# Patient Record
Sex: Female | Born: 1935
Health system: Southern US, Community
[De-identification: ages and names within clinical notes are randomized; demographics above are authoritative.]

## PROBLEM LIST (undated history)

## (undated) DIAGNOSIS — I639 Cerebral infarction, unspecified: Secondary | ICD-10-CM

## (undated) DIAGNOSIS — R413 Other amnesia: Secondary | ICD-10-CM

## (undated) DIAGNOSIS — S12100A Unspecified displaced fracture of second cervical vertebra, initial encounter for closed fracture: Secondary | ICD-10-CM

## (undated) HISTORY — PX: SKIN CANCER EXCISION: SHX779

## (undated) HISTORY — PX: ABDOMINAL HYSTERECTOMY: SHX81

## (undated) HISTORY — PX: CATARACT EXTRACTION: SUR2

---

## 2011-04-03 DIAGNOSIS — L293 Anogenital pruritus, unspecified: Secondary | ICD-10-CM | POA: Diagnosis not present

## 2011-04-03 DIAGNOSIS — N952 Postmenopausal atrophic vaginitis: Secondary | ICD-10-CM | POA: Diagnosis not present

## 2011-06-20 DIAGNOSIS — N760 Acute vaginitis: Secondary | ICD-10-CM | POA: Diagnosis not present

## 2011-08-01 DIAGNOSIS — L293 Anogenital pruritus, unspecified: Secondary | ICD-10-CM | POA: Diagnosis not present

## 2011-09-18 DIAGNOSIS — Z23 Encounter for immunization: Secondary | ICD-10-CM | POA: Diagnosis not present

## 2011-09-18 DIAGNOSIS — Z0000 Encounter for general adult medical examination without abnormal findings: Secondary | ICD-10-CM | POA: Diagnosis not present

## 2011-09-18 DIAGNOSIS — Z136 Encounter for screening for cardiovascular disorders: Secondary | ICD-10-CM | POA: Diagnosis not present

## 2011-09-18 DIAGNOSIS — R319 Hematuria, unspecified: Secondary | ICD-10-CM | POA: Diagnosis not present

## 2011-09-18 DIAGNOSIS — Z85828 Personal history of other malignant neoplasm of skin: Secondary | ICD-10-CM | POA: Diagnosis not present

## 2011-09-18 DIAGNOSIS — N760 Acute vaginitis: Secondary | ICD-10-CM | POA: Diagnosis not present

## 2011-09-18 DIAGNOSIS — M810 Age-related osteoporosis without current pathological fracture: Secondary | ICD-10-CM | POA: Diagnosis not present

## 2011-09-25 DIAGNOSIS — H2510 Age-related nuclear cataract, unspecified eye: Secondary | ICD-10-CM | POA: Diagnosis not present

## 2011-09-27 DIAGNOSIS — N904 Leukoplakia of vulva: Secondary | ICD-10-CM | POA: Diagnosis not present

## 2011-09-27 DIAGNOSIS — F329 Major depressive disorder, single episode, unspecified: Secondary | ICD-10-CM | POA: Diagnosis not present

## 2011-09-27 DIAGNOSIS — M810 Age-related osteoporosis without current pathological fracture: Secondary | ICD-10-CM | POA: Diagnosis not present

## 2011-10-25 DIAGNOSIS — N72 Inflammatory disease of cervix uteri: Secondary | ICD-10-CM | POA: Diagnosis not present

## 2011-10-28 DIAGNOSIS — S01501A Unspecified open wound of lip, initial encounter: Secondary | ICD-10-CM | POA: Diagnosis not present

## 2011-10-28 DIAGNOSIS — L539 Erythematous condition, unspecified: Secondary | ICD-10-CM | POA: Diagnosis not present

## 2011-10-28 DIAGNOSIS — R229 Localized swelling, mass and lump, unspecified: Secondary | ICD-10-CM | POA: Diagnosis not present

## 2011-10-28 DIAGNOSIS — S20219A Contusion of unspecified front wall of thorax, initial encounter: Secondary | ICD-10-CM | POA: Diagnosis not present

## 2011-10-28 DIAGNOSIS — S62109A Fracture of unspecified carpal bone, unspecified wrist, initial encounter for closed fracture: Secondary | ICD-10-CM | POA: Diagnosis not present

## 2011-10-31 DIAGNOSIS — M19049 Primary osteoarthritis, unspecified hand: Secondary | ICD-10-CM | POA: Diagnosis not present

## 2011-11-05 DIAGNOSIS — Z4802 Encounter for removal of sutures: Secondary | ICD-10-CM | POA: Diagnosis not present

## 2011-11-05 DIAGNOSIS — Z85828 Personal history of other malignant neoplasm of skin: Secondary | ICD-10-CM | POA: Diagnosis not present

## 2011-11-05 DIAGNOSIS — T148XXA Other injury of unspecified body region, initial encounter: Secondary | ICD-10-CM | POA: Diagnosis not present

## 2011-11-14 DIAGNOSIS — M25549 Pain in joints of unspecified hand: Secondary | ICD-10-CM | POA: Diagnosis not present

## 2011-12-05 DIAGNOSIS — S0993XA Unspecified injury of face, initial encounter: Secondary | ICD-10-CM | POA: Diagnosis not present

## 2011-12-05 DIAGNOSIS — S199XXA Unspecified injury of neck, initial encounter: Secondary | ICD-10-CM | POA: Diagnosis not present

## 2012-09-19 DIAGNOSIS — M810 Age-related osteoporosis without current pathological fracture: Secondary | ICD-10-CM | POA: Diagnosis not present

## 2012-09-19 DIAGNOSIS — Z23 Encounter for immunization: Secondary | ICD-10-CM | POA: Diagnosis not present

## 2012-09-19 DIAGNOSIS — Z0000 Encounter for general adult medical examination without abnormal findings: Secondary | ICD-10-CM | POA: Diagnosis not present

## 2012-09-19 DIAGNOSIS — Z131 Encounter for screening for diabetes mellitus: Secondary | ICD-10-CM | POA: Diagnosis not present

## 2012-09-19 DIAGNOSIS — Z136 Encounter for screening for cardiovascular disorders: Secondary | ICD-10-CM | POA: Diagnosis not present

## 2012-09-22 ENCOUNTER — Other Ambulatory Visit: Payer: Self-pay | Admitting: Family Medicine

## 2012-09-22 DIAGNOSIS — M810 Age-related osteoporosis without current pathological fracture: Secondary | ICD-10-CM

## 2012-09-22 DIAGNOSIS — Z1231 Encounter for screening mammogram for malignant neoplasm of breast: Secondary | ICD-10-CM

## 2012-09-30 DIAGNOSIS — H2510 Age-related nuclear cataract, unspecified eye: Secondary | ICD-10-CM | POA: Diagnosis not present

## 2012-10-01 DIAGNOSIS — Z1211 Encounter for screening for malignant neoplasm of colon: Secondary | ICD-10-CM | POA: Diagnosis not present

## 2012-10-14 ENCOUNTER — Ambulatory Visit
Admission: RE | Admit: 2012-10-14 | Discharge: 2012-10-14 | Disposition: A | Discharge status: Home / Self Care | Payer: Medicare Other | Source: Ambulatory Visit | Attending: Family Medicine | Admitting: Family Medicine

## 2012-10-14 DIAGNOSIS — M810 Age-related osteoporosis without current pathological fracture: Secondary | ICD-10-CM

## 2012-10-14 DIAGNOSIS — Z1231 Encounter for screening mammogram for malignant neoplasm of breast: Secondary | ICD-10-CM | POA: Diagnosis not present

## 2012-11-07 DIAGNOSIS — H2589 Other age-related cataract: Secondary | ICD-10-CM | POA: Diagnosis not present

## 2012-11-07 DIAGNOSIS — H04129 Dry eye syndrome of unspecified lacrimal gland: Secondary | ICD-10-CM | POA: Diagnosis not present

## 2012-12-01 DIAGNOSIS — H25049 Posterior subcapsular polar age-related cataract, unspecified eye: Secondary | ICD-10-CM | POA: Diagnosis not present

## 2012-12-01 DIAGNOSIS — H2510 Age-related nuclear cataract, unspecified eye: Secondary | ICD-10-CM | POA: Diagnosis not present

## 2012-12-01 DIAGNOSIS — H25019 Cortical age-related cataract, unspecified eye: Secondary | ICD-10-CM | POA: Diagnosis not present

## 2012-12-02 ENCOUNTER — Emergency Department (HOSPITAL_BASED_OUTPATIENT_CLINIC_OR_DEPARTMENT_OTHER): Payer: Medicare Other

## 2012-12-02 ENCOUNTER — Encounter (HOSPITAL_BASED_OUTPATIENT_CLINIC_OR_DEPARTMENT_OTHER): Payer: Self-pay | Admitting: Emergency Medicine

## 2012-12-02 ENCOUNTER — Emergency Department (HOSPITAL_BASED_OUTPATIENT_CLINIC_OR_DEPARTMENT_OTHER)
Admission: EM | Admit: 2012-12-02 | Discharge: 2012-12-03 | Disposition: A | Discharge status: Home / Self Care | Payer: Medicare Other | Attending: Emergency Medicine | Admitting: Emergency Medicine

## 2012-12-02 DIAGNOSIS — W010XXA Fall on same level from slipping, tripping and stumbling without subsequent striking against object, initial encounter: Secondary | ICD-10-CM | POA: Insufficient documentation

## 2012-12-02 DIAGNOSIS — Y92009 Unspecified place in unspecified non-institutional (private) residence as the place of occurrence of the external cause: Secondary | ICD-10-CM | POA: Insufficient documentation

## 2012-12-02 DIAGNOSIS — IMO0002 Reserved for concepts with insufficient information to code with codable children: Secondary | ICD-10-CM

## 2012-12-02 DIAGNOSIS — S79919A Unspecified injury of unspecified hip, initial encounter: Secondary | ICD-10-CM | POA: Insufficient documentation

## 2012-12-02 DIAGNOSIS — W1809XA Striking against other object with subsequent fall, initial encounter: Secondary | ICD-10-CM | POA: Insufficient documentation

## 2012-12-02 DIAGNOSIS — Y9301 Activity, walking, marching and hiking: Secondary | ICD-10-CM | POA: Insufficient documentation

## 2012-12-02 DIAGNOSIS — S0180XA Unspecified open wound of other part of head, initial encounter: Secondary | ICD-10-CM | POA: Diagnosis not present

## 2012-12-02 NOTE — ED Notes (Signed)
Pt fell  Has 1 cm lac to rt eye brow, abrasion to rt cheek,  Denies loc,  Pt a&o C/o left hip pain  Good lower ext movement

## 2012-12-02 NOTE — ED Notes (Signed)
Patient transported to X-ray 

## 2012-12-02 NOTE — ED Provider Notes (Signed)
CSN: [id number]     Arrival date & time 12/02/12  2324 History   First MD Initiated Contact with Patient 12/02/12 2328     This chart was scribed for Leona Pressly Smitty Cords, MD by Manuela Schwartz, ED scribe. This patient was seen in room MH10/MH10 and the patient's care was started at 2328.  Chief Complaint  Patient presents with  . Fall   Patient is a 79 y.o. female presenting with fall. The history is provided by the patient. No language interpreter was used.  Fall This is a new problem. The current episode started 1 to 2 hours ago. The problem has not changed since onset.Pertinent negatives include no chest pain, no abdominal pain and no shortness of breath. Nothing aggravates the symptoms. Nothing relieves the symptoms. She has tried nothing for the symptoms.   HPI Comments: Cheyenne Andrews is a 79 y.o. female who presents to the Emergency Department complaining of laceration to her right eyebrow and left hip pain after she tripped/fell this PM at home into the corner of a wall, no LOC, ambulating normally w/out pain. She states no other injuries besides lac to eyebrow and left hip pain.   History reviewed. No pertinent past medical history. Past Surgical History  Procedure Laterality Date  . Cataract extraction    . Abdominal hysterectomy     No family history on file. History  Substance Use Topics  . Smoking status: Never Smoker   . Smokeless tobacco: Not on file  . Alcohol Use: No   OB History   Grav Para Term Preterm Abortions TAB SAB Ect Mult Living                 Review of Systems  Constitutional: Negative for fever and chills.  HENT: Negative for congestion and rhinorrhea.   Respiratory: Negative for cough and shortness of breath.   Cardiovascular: Negative for chest pain.  Gastrointestinal: Negative for nausea, vomiting, abdominal pain and diarrhea.  Musculoskeletal: Negative for back pain.       Left hip pain  Skin: Positive for wound (lac to right eyebrow).  Negative for color change and rash.  Neurological: Negative for syncope.  All other systems reviewed and are negative.   A complete 10 system review of systems was obtained and all systems are negative except as noted in the HPI and PMH.   Allergies  Review of patient's allergies indicates no known allergies.  Home Medications   Current Outpatient Rx  Name  Route  Sig  Dispense  Refill  . FLUoxetine (PROZAC) 10 MG capsule   Oral   Take 10 mg by mouth daily.         . Multiple Vitamin (MULTIVITAMIN) tablet   Oral   Take 1 tablet by mouth daily.          Triage Vitals: Ht 5\' 2"  (1.575 m)  Wt 114 lb (51.71 kg)  BMI 20.85 kg/m2 Physical Exam  Nursing note and vitals reviewed. Constitutional: She is oriented to person, place, and time. She appears well-developed and well-nourished. No distress.  HENT:  Head: Normocephalic and atraumatic.  1 cm vertical laceration through right eyebrow No hemotympanum on right or left  Eyes: Conjunctivae are normal. Right eye exhibits no discharge. Left eye exhibits no discharge.  Neck: Normal range of motion. No tracheal deviation present.  Cardiovascular: Normal rate, regular rhythm and normal heart sounds.   No murmur heard. Pulmonary/Chest: Effort normal and breath sounds normal. No respiratory distress. She has no  wheezes. She has no rales.  Musculoskeletal: Normal range of motion. She exhibits no edema.  No bruising to left hip  Neurological: She is alert and oriented to person, place, and time.  Skin: Skin is warm and dry.  Psychiatric: She has a normal mood and affect. Thought content normal.    ED Course  LACERATION REPAIR Date/Time: 12/03/2012 12:29 AM Performed by: Jasmine Awe Authorized by: Jasmine Awe Consent: Verbal consent obtained. Consent given by: patient Patient identity confirmed: arm band Body area: head/neck Location details: right eyebrow Laceration length: 1 cm Tendon involvement:  none Nerve involvement: none Vascular damage: no Anesthesia: local infiltration Local anesthetic: lidocaine 1% with epinephrine Anesthetic total: 3 ml Patient sedated: no Irrigation solution: saline Irrigation method: syringe Amount of cleaning: extensive Debridement: none Degree of undermining: none Skin closure: 6-0 nylon Technique: simple Approximation: close Approximation difficulty: simple Dressing: 4x4 sterile gauze Patient tolerance: Patient tolerated the procedure well with no immediate complications.   (including critical care time) DIAGNOSTIC STUDIES: Oxygen Saturation is 98% on room air, normal by my interpretation.    COORDINATION OF CARE: At 1130 PM Discussed treatment plan with patient which includes left hip X-ray, lac repair. Patient agrees.   Labs Review Labs Reviewed - No data to display Imaging Review No results found.  EKG Interpretation   None       MDM  No diagnosis found.  Suture removal in 5 days  I personally performed the services described in this documentation, which was scribed in my presence. The recorded information has been reviewed and is accurate.      Jasmine Awe, MD 12/03/12 0030

## 2012-12-02 NOTE — ED Notes (Signed)
Returned from ct 

## 2012-12-02 NOTE — ED Notes (Signed)
Pt reports slipped while walking and fell into the edge of door jam.  No loc. Ambulatory.  Laceration to right eyebrow area.

## 2013-02-09 ENCOUNTER — Ambulatory Visit: Payer: Medicare Other | Attending: Family Medicine | Admitting: Rehabilitative and Restorative Service Providers"

## 2013-02-09 DIAGNOSIS — R269 Unspecified abnormalities of gait and mobility: Secondary | ICD-10-CM | POA: Insufficient documentation

## 2013-02-09 DIAGNOSIS — M6281 Muscle weakness (generalized): Secondary | ICD-10-CM | POA: Insufficient documentation

## 2013-02-09 DIAGNOSIS — IMO0001 Reserved for inherently not codable concepts without codable children: Secondary | ICD-10-CM | POA: Insufficient documentation

## 2013-02-17 ENCOUNTER — Ambulatory Visit: Payer: Medicare Other | Attending: Family Medicine | Admitting: Rehabilitative and Restorative Service Providers"

## 2013-02-17 DIAGNOSIS — R269 Unspecified abnormalities of gait and mobility: Secondary | ICD-10-CM | POA: Diagnosis not present

## 2013-02-17 DIAGNOSIS — IMO0001 Reserved for inherently not codable concepts without codable children: Secondary | ICD-10-CM | POA: Diagnosis not present

## 2013-02-17 DIAGNOSIS — M6281 Muscle weakness (generalized): Secondary | ICD-10-CM | POA: Insufficient documentation

## 2013-02-25 ENCOUNTER — Ambulatory Visit: Payer: Medicare Other | Admitting: Rehabilitative and Restorative Service Providers"

## 2013-02-25 DIAGNOSIS — M6281 Muscle weakness (generalized): Secondary | ICD-10-CM | POA: Diagnosis not present

## 2013-02-25 DIAGNOSIS — IMO0001 Reserved for inherently not codable concepts without codable children: Secondary | ICD-10-CM | POA: Diagnosis not present

## 2013-02-25 DIAGNOSIS — R269 Unspecified abnormalities of gait and mobility: Secondary | ICD-10-CM | POA: Diagnosis not present

## 2013-03-02 ENCOUNTER — Ambulatory Visit: Payer: Medicare Other | Admitting: Rehabilitative and Restorative Service Providers"

## 2013-03-02 DIAGNOSIS — M6281 Muscle weakness (generalized): Secondary | ICD-10-CM | POA: Diagnosis not present

## 2013-03-02 DIAGNOSIS — IMO0001 Reserved for inherently not codable concepts without codable children: Secondary | ICD-10-CM | POA: Diagnosis not present

## 2013-03-02 DIAGNOSIS — R269 Unspecified abnormalities of gait and mobility: Secondary | ICD-10-CM | POA: Diagnosis not present

## 2013-03-04 DIAGNOSIS — L909 Atrophic disorder of skin, unspecified: Secondary | ICD-10-CM | POA: Diagnosis not present

## 2013-03-04 DIAGNOSIS — L570 Actinic keratosis: Secondary | ICD-10-CM | POA: Diagnosis not present

## 2013-03-04 DIAGNOSIS — Z85828 Personal history of other malignant neoplasm of skin: Secondary | ICD-10-CM | POA: Diagnosis not present

## 2013-03-04 DIAGNOSIS — L819 Disorder of pigmentation, unspecified: Secondary | ICD-10-CM | POA: Diagnosis not present

## 2013-03-04 DIAGNOSIS — L821 Other seborrheic keratosis: Secondary | ICD-10-CM | POA: Diagnosis not present

## 2013-03-17 ENCOUNTER — Ambulatory Visit: Payer: Medicare Other | Attending: Family Medicine | Admitting: Rehabilitative and Restorative Service Providers"

## 2013-03-17 DIAGNOSIS — IMO0001 Reserved for inherently not codable concepts without codable children: Secondary | ICD-10-CM | POA: Insufficient documentation

## 2013-03-17 DIAGNOSIS — M6281 Muscle weakness (generalized): Secondary | ICD-10-CM | POA: Diagnosis not present

## 2013-03-17 DIAGNOSIS — R269 Unspecified abnormalities of gait and mobility: Secondary | ICD-10-CM | POA: Insufficient documentation

## 2013-04-21 DIAGNOSIS — H2510 Age-related nuclear cataract, unspecified eye: Secondary | ICD-10-CM | POA: Diagnosis not present

## 2013-07-15 DIAGNOSIS — H2589 Other age-related cataract: Secondary | ICD-10-CM | POA: Diagnosis not present

## 2013-07-15 DIAGNOSIS — Z961 Presence of intraocular lens: Secondary | ICD-10-CM | POA: Diagnosis not present

## 2013-07-21 DIAGNOSIS — H2510 Age-related nuclear cataract, unspecified eye: Secondary | ICD-10-CM | POA: Diagnosis not present

## 2013-07-27 DIAGNOSIS — H25049 Posterior subcapsular polar age-related cataract, unspecified eye: Secondary | ICD-10-CM | POA: Diagnosis not present

## 2013-07-27 DIAGNOSIS — H2510 Age-related nuclear cataract, unspecified eye: Secondary | ICD-10-CM | POA: Diagnosis not present

## 2013-07-27 DIAGNOSIS — H25019 Cortical age-related cataract, unspecified eye: Secondary | ICD-10-CM | POA: Diagnosis not present

## 2013-09-01 DIAGNOSIS — L678 Other hair color and hair shaft abnormalities: Secondary | ICD-10-CM | POA: Diagnosis not present

## 2013-09-01 DIAGNOSIS — L738 Other specified follicular disorders: Secondary | ICD-10-CM | POA: Diagnosis not present

## 2013-09-01 DIAGNOSIS — Z85828 Personal history of other malignant neoplasm of skin: Secondary | ICD-10-CM | POA: Diagnosis not present

## 2013-09-01 DIAGNOSIS — L570 Actinic keratosis: Secondary | ICD-10-CM | POA: Diagnosis not present

## 2013-09-24 DIAGNOSIS — F329 Major depressive disorder, single episode, unspecified: Secondary | ICD-10-CM | POA: Diagnosis not present

## 2013-09-24 DIAGNOSIS — Z1211 Encounter for screening for malignant neoplasm of colon: Secondary | ICD-10-CM | POA: Diagnosis not present

## 2013-09-24 DIAGNOSIS — M810 Age-related osteoporosis without current pathological fracture: Secondary | ICD-10-CM | POA: Diagnosis not present

## 2013-09-24 DIAGNOSIS — E785 Hyperlipidemia, unspecified: Secondary | ICD-10-CM | POA: Diagnosis not present

## 2013-09-24 DIAGNOSIS — Z23 Encounter for immunization: Secondary | ICD-10-CM | POA: Diagnosis not present

## 2013-09-24 DIAGNOSIS — F3289 Other specified depressive episodes: Secondary | ICD-10-CM | POA: Diagnosis not present

## 2013-09-24 DIAGNOSIS — Z0000 Encounter for general adult medical examination without abnormal findings: Secondary | ICD-10-CM | POA: Diagnosis not present

## 2013-09-28 DIAGNOSIS — L570 Actinic keratosis: Secondary | ICD-10-CM | POA: Diagnosis not present

## 2013-10-14 DIAGNOSIS — Z1211 Encounter for screening for malignant neoplasm of colon: Secondary | ICD-10-CM | POA: Diagnosis not present

## 2013-10-23 DIAGNOSIS — L570 Actinic keratosis: Secondary | ICD-10-CM | POA: Diagnosis not present

## 2013-11-05 DIAGNOSIS — Z23 Encounter for immunization: Secondary | ICD-10-CM | POA: Diagnosis not present

## 2013-12-02 DIAGNOSIS — H26493 Other secondary cataract, bilateral: Secondary | ICD-10-CM | POA: Diagnosis not present

## 2013-12-22 DIAGNOSIS — Z85828 Personal history of other malignant neoplasm of skin: Secondary | ICD-10-CM | POA: Diagnosis not present

## 2013-12-22 DIAGNOSIS — L570 Actinic keratosis: Secondary | ICD-10-CM | POA: Diagnosis not present

## 2014-03-24 ENCOUNTER — Encounter (HOSPITAL_BASED_OUTPATIENT_CLINIC_OR_DEPARTMENT_OTHER): Payer: Self-pay

## 2014-03-24 ENCOUNTER — Other Ambulatory Visit: Payer: Self-pay

## 2014-03-24 ENCOUNTER — Emergency Department (HOSPITAL_BASED_OUTPATIENT_CLINIC_OR_DEPARTMENT_OTHER): Payer: Medicare Other

## 2014-03-24 ENCOUNTER — Other Ambulatory Visit (HOSPITAL_BASED_OUTPATIENT_CLINIC_OR_DEPARTMENT_OTHER): Payer: Self-pay

## 2014-03-24 ENCOUNTER — Inpatient Hospital Stay (HOSPITAL_BASED_OUTPATIENT_CLINIC_OR_DEPARTMENT_OTHER)
Admission: EM | Admit: 2014-03-24 | Discharge: 2014-03-26 | DRG: 312 | Disposition: A | Discharge status: Home Health | Payer: Medicare Other | Attending: Internal Medicine | Admitting: Internal Medicine

## 2014-03-24 DIAGNOSIS — S12101A Unspecified nondisplaced fracture of second cervical vertebra, initial encounter for closed fracture: Secondary | ICD-10-CM | POA: Diagnosis present

## 2014-03-24 DIAGNOSIS — I10 Essential (primary) hypertension: Secondary | ICD-10-CM | POA: Insufficient documentation

## 2014-03-24 DIAGNOSIS — F329 Major depressive disorder, single episode, unspecified: Secondary | ICD-10-CM | POA: Diagnosis not present

## 2014-03-24 DIAGNOSIS — S12112A Nondisplaced Type II dens fracture, initial encounter for closed fracture: Secondary | ICD-10-CM | POA: Diagnosis not present

## 2014-03-24 DIAGNOSIS — M542 Cervicalgia: Secondary | ICD-10-CM | POA: Diagnosis not present

## 2014-03-24 DIAGNOSIS — I6523 Occlusion and stenosis of bilateral carotid arteries: Secondary | ICD-10-CM | POA: Diagnosis not present

## 2014-03-24 DIAGNOSIS — W19XXXA Unspecified fall, initial encounter: Secondary | ICD-10-CM

## 2014-03-24 DIAGNOSIS — S12100A Unspecified displaced fracture of second cervical vertebra, initial encounter for closed fracture: Secondary | ICD-10-CM | POA: Diagnosis not present

## 2014-03-24 DIAGNOSIS — R55 Syncope and collapse: Secondary | ICD-10-CM | POA: Diagnosis not present

## 2014-03-24 DIAGNOSIS — S199XXA Unspecified injury of neck, initial encounter: Secondary | ICD-10-CM | POA: Diagnosis not present

## 2014-03-24 DIAGNOSIS — I5032 Chronic diastolic (congestive) heart failure: Secondary | ICD-10-CM | POA: Insufficient documentation

## 2014-03-24 DIAGNOSIS — Z79899 Other long term (current) drug therapy: Secondary | ICD-10-CM | POA: Diagnosis not present

## 2014-03-24 DIAGNOSIS — Y92096 Garden or yard of other non-institutional residence as the place of occurrence of the external cause: Secondary | ICD-10-CM

## 2014-03-24 DIAGNOSIS — W1839XA Other fall on same level, initial encounter: Secondary | ICD-10-CM | POA: Diagnosis present

## 2014-03-24 LAB — BASIC METABOLIC PANEL
Anion gap: 11 (ref 5–15)
BUN: 15 mg/dL (ref 6–23)
CALCIUM: 9.1 mg/dL (ref 8.4–10.5)
CHLORIDE: 107 mmol/L (ref 96–112)
CO2: 23 mmol/L (ref 19–32)
Creatinine, Ser: 0.84 mg/dL (ref 0.50–1.10)
GFR, EST AFRICAN AMERICAN: 75 mL/min — AB (ref >90)
GFR, EST NON AFRICAN AMERICAN: 64 mL/min — AB (ref >90)
GLUCOSE: 108 mg/dL — AB (ref 70–99)
POTASSIUM: 4.2 mmol/L (ref 3.5–5.1)
Sodium: 141 mmol/L (ref 135–145)

## 2014-03-24 LAB — TROPONIN I: Troponin I: 0.05 ng/mL — ABNORMAL HIGH (ref <0.031)

## 2014-03-24 LAB — CBC
HCT: 41.4 % (ref 36.0–46.0)
HEMATOCRIT: 42.8 % (ref 36.0–46.0)
HEMOGLOBIN: 14.5 g/dL (ref 12.0–15.0)
HEMOGLOBIN: 14.1 g/dL (ref 12.0–15.0)
MCH: 31.6 pg (ref 26.0–34.0)
MCH: 31.1 pg (ref 26.0–34.0)
MCHC: 33.9 g/dL (ref 30.0–36.0)
MCHC: 34.1 g/dL (ref 30.0–36.0)
MCV: 93.2 fL (ref 78.0–100.0)
MCV: 91.4 fL (ref 78.0–100.0)
Platelets: 202 10*3/uL (ref 150–400)
Platelets: 192 10*3/uL (ref 150–400)
RBC: 4.59 MIL/uL (ref 3.87–5.11)
RBC: 4.53 MIL/uL (ref 3.87–5.11)
RDW: 13.9 % (ref 11.5–15.5)
RDW: 13.8 % (ref 11.5–15.5)
WBC: 10 10*3/uL (ref 4.0–10.5)
WBC: 7.9 10*3/uL (ref 4.0–10.5)

## 2014-03-24 MED ORDER — ASPIRIN EC 325 MG PO TBEC
325.0000 mg | DELAYED_RELEASE_TABLET | Freq: Every day | ORAL | Status: DC
  Administered 2014-03-25: 325 mg via ORAL
  Filled 2014-03-24 (×2): qty 1

## 2014-03-24 MED ORDER — SODIUM CHLORIDE 0.9 % IV SOLN
INTRAVENOUS | Status: DC
  Administered 2014-03-24: 23:00:00 via INTRAVENOUS

## 2014-03-24 MED ORDER — ACETAMINOPHEN 650 MG RE SUPP: 650.0000 mg | Freq: Four times a day (QID) | RECTAL | Status: DC | PRN

## 2014-03-24 MED ORDER — HEPARIN SODIUM (PORCINE) 5000 UNIT/ML IJ SOLN
5000.0000 [IU] | Freq: Three times a day (TID) | INTRAMUSCULAR | Status: DC
  Administered 2014-03-24 – 2014-03-26 (×5): 5000 [IU] via SUBCUTANEOUS
  Filled 2014-03-24 (×8): qty 1

## 2014-03-24 MED ORDER — FOLIC ACID 1 MG PO TABS
1.0000 mg | ORAL_TABLET | Freq: Every day | ORAL | Status: DC
  Filled 2014-03-24 (×2): qty 1

## 2014-03-24 MED ORDER — ONDANSETRON HCL 4 MG/2ML IJ SOLN: 4.0000 mg | Freq: Four times a day (QID) | INTRAMUSCULAR | Status: DC | PRN

## 2014-03-24 MED ORDER — VITAMIN B-1 100 MG PO TABS
100.0000 mg | ORAL_TABLET | Freq: Every day | ORAL | Status: DC
  Filled 2014-03-24 (×2): qty 1

## 2014-03-24 MED ORDER — ACETAMINOPHEN 325 MG PO TABS
650.0000 mg | ORAL_TABLET | Freq: Four times a day (QID) | ORAL | Status: DC | PRN
  Administered 2014-03-25 – 2014-03-26 (×3): 650 mg via ORAL
  Filled 2014-03-24 (×3): qty 2

## 2014-03-24 MED ORDER — OXYCODONE HCL 5 MG PO TABS
5.0000 mg | ORAL_TABLET | ORAL | Status: DC | PRN
  Administered 2014-03-24 – 2014-03-25 (×3): 5 mg via ORAL
  Filled 2014-03-24 (×5): qty 1

## 2014-03-24 MED ORDER — FLUOXETINE HCL 10 MG PO CAPS
10.0000 mg | ORAL_CAPSULE | Freq: Every day | ORAL | Status: DC
  Administered 2014-03-25 – 2014-03-26 (×2): 10 mg via ORAL
  Filled 2014-03-24 (×2): qty 1

## 2014-03-24 MED ORDER — ADULT MULTIVITAMIN W/MINERALS CH
1.0000 | ORAL_TABLET | Freq: Every day | ORAL | Status: DC
  Filled 2014-03-24 (×2): qty 1

## 2014-03-24 MED ORDER — ONDANSETRON HCL 4 MG PO TABS: 4.0000 mg | ORAL_TABLET | Freq: Four times a day (QID) | ORAL | Status: DC | PRN

## 2014-03-24 MED ORDER — SODIUM CHLORIDE 0.9 % IJ SOLN
3.0000 mL | Freq: Two times a day (BID) | INTRAMUSCULAR | Status: DC
  Administered 2014-03-24 – 2014-03-25 (×3): 3 mL via INTRAVENOUS

## 2014-03-24 NOTE — ED Provider Notes (Signed)
CSN: [id number]     Arrival date & time 03/24/14  1543 History   First MD Initiated Contact with Patient 03/24/14 1551     Chief Complaint  Patient presents with  . 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 She did not trip or slip on anything. She member passing out. She denies any preceding chest pain, dizziness, shortness of breath.  Patient is a 81 y.o. female presenting with fall. The history is provided by the patient.  Fall This is a new problem. The current episode started less than 1 hour ago. Episode frequency: once. The problem has been resolved. Pertinent negatives include no chest pain, no abdominal pain and no shortness of breath. Nothing aggravates the symptoms. Nothing relieves the symptoms.    History reviewed. No pertinent past medical history. Past Surgical History  Procedure Laterality Date  . Cataract extraction    . Abdominal hysterectomy     No family history on file. History  Substance Use Topics  . Smoking status: Never Smoker   . Smokeless tobacco: Not on file  . Alcohol Use: No   OB History    No data available     Review of Systems  Constitutional: Negative for fever and chills.  Respiratory: Negative for cough and shortness of breath.   Cardiovascular: Negative for chest pain and leg swelling.  Gastrointestinal: Positive for nausea. Negative for vomiting and abdominal pain.  All other systems reviewed and are negative.     Allergies  Review of patient's allergies indicates no known allergies.  Home Medications   Prior to Admission medications   Medication Sig Start Date End Date Taking? Authorizing Provider  FLUoxetine (PROZAC) 10 MG capsule Take 10 mg by mouth daily.    Historical Provider, MD  Multiple Vitamin (MULTIVITAMIN) tablet Take 1 tablet by mouth daily.    Historical Provider,  MD   BP 158/63 mmHg  Pulse 66  Temp(Src) 98.1 F (36.7 C) (Oral)  Resp 16  Ht 5\' 2"  (1.575 m)  Wt 114 lb (51.71 kg)  BMI 20.85 kg/m2 Physical Exam  Constitutional: She is oriented to person, place, and time. She appears well-developed and well-nourished. No distress.  HENT:  Head: Normocephalic.    Mouth/Throat: Oropharynx is clear and moist.  Eyes: EOM are normal. Pupils are equal, round, and reactive to light.  Neck: Normal range of motion. Neck supple.  Cardiovascular: Normal rate and regular rhythm.  Exam reveals no friction rub.   No murmur heard. Pulmonary/Chest: Effort normal and breath sounds normal. No respiratory distress. She has no wheezes. She has no rales.  Abdominal: Soft. She exhibits no distension. There is no tenderness. There is no rebound.  Musculoskeletal: Normal range of motion. She exhibits no edema.  Neurological: She is alert and oriented to person, place, and time.  Skin: She is not diaphoretic.  Nursing note and vitals reviewed.   ED Course  Procedures (including critical care time) Labs Review Labs Reviewed - No data to display  Imaging Review Ct Head Wo Contrast  03/24/2014   CLINICAL DATA:  Fall face first on the concrete. Abrasions to right frontal region. Severe posterior neck pain.  EXAM: CT HEAD WITHOUT CONTRAST  CT CERVICAL SPINE WITHOUT CONTRAST  TECHNIQUE: Multidetector CT imaging of the head and cervical spine was performed following the standard protocol without intravenous contrast. Multiplanar CT image reconstructions of the cervical spine were  also generated.  COMPARISON:  CT head 12/02/2012  FINDINGS: CT HEAD FINDINGS  Mild chronic microvascular changes throughout the deep white matter. No acute intracranial abnormality. Specifically, no hemorrhage, hydrocephalus, mass lesion, acute infarction, or significant intracranial injury. No acute calvarial abnormality. Soft tissue swelling over the right forehead.  Visualized paranasal sinuses and  mastoids clear. Orbital soft tissues unremarkable.  CT CERVICAL SPINE FINDINGS  There is a fracture through the base of the odontoid, nondisplaced. No overlying soft tissue swelling. No additional fracture. No epidural or paraspinal hematoma. Normal alignment.  IMPRESSION: Chronic microvascular disease.  No acute intracranial abnormality.  Nondisplaced type 2 odontoid fracture.  Critical Value/emergent results were called by telephone at the time of interpretation on 03/24/2014 at 5:02 pm to Dr. Evelina Bucy , who verbally acknowledged these results.   Electronically Signed   By: Rolm Baptise M.D.   On: 03/24/2014 17:03   Ct Cervical Spine Wo Contrast  03/24/2014   CLINICAL DATA:  Fall face first on the concrete. Abrasions to right frontal region. Severe posterior neck pain.  EXAM: CT HEAD WITHOUT CONTRAST  CT CERVICAL SPINE WITHOUT CONTRAST  TECHNIQUE: Multidetector CT imaging of the head and cervical spine was performed following the standard protocol without intravenous contrast. Multiplanar CT image reconstructions of the cervical spine were also generated.  COMPARISON:  CT head 12/02/2012  FINDINGS: CT HEAD FINDINGS  Mild chronic microvascular changes throughout the deep white matter. No acute intracranial abnormality. Specifically, no hemorrhage, hydrocephalus, mass lesion, acute infarction, or significant intracranial injury. No acute calvarial abnormality. Soft tissue swelling over the right forehead.  Visualized paranasal sinuses and mastoids clear. Orbital soft tissues unremarkable.  CT CERVICAL SPINE FINDINGS  There is a fracture through the base of the odontoid, nondisplaced. No overlying soft tissue swelling. No additional fracture. No epidural or paraspinal hematoma. Normal alignment.  IMPRESSION: Chronic microvascular disease.  No acute intracranial abnormality.  Nondisplaced type 2 odontoid fracture.  Critical Value/emergent results were called by telephone at the time of interpretation on 03/24/2014  at 5:02 pm to Dr. Evelina Bucy , who verbally acknowledged these results.   Electronically Signed   By: Rolm Baptise M.D.   On: 03/24/2014 17:03     EKG Interpretation   Date/Time:  Wednesday March 24 2014 16:13:15 EST Ventricular Rate:  69 PR Interval:  158 QRS Duration: 74 QT Interval:  430 QTC Calculation: 460 R Axis:   83 Text Interpretation:  Sinus rhythm with Premature atrial complexes  Otherwise normal ECG No prior for comparison Confirmed by Mingo Amber  MD,  Raynie Steinhaus ([id number]) on 03/24/2014 4:18:51 PM      MDM   Final diagnoses:  Fall  Syncope, unspecified syncope type  C2 cervical fracture, closed, initial encounter    81 year old female after a fall. 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 On her last trip she is very tired and then fell over. She did not have any preceding chest pain, dizziness, shortness of breath. She did have some associated nausea or vomiting. He felt very tired and thinks this is likely tissues out there all day long. She thinks this could also be her vertigo. She said this is similar to her prior vertigo when she fell for no reason. She did not have any movement sensation or dizziness, so vertigo seems unlikely to me. No history of arrhythmias. Here she's feeling well. She has some small abrasions to the right forehead, right upper lid, and  nose. No nasoseptal hematoma. Normal cranial nerves. Normal drink and sensation in bilateral upper and lower extremities. Normal coordination. EKG normal. We'll scan her head and neck since she fell. Also check some basic labs. CT neck shows C2 odontoid fracture. Dr. Christella Noa with [facility] recommended collar (already placed) and f/u with him as an outpatient. Troponin mildly elevated. EKG ok, no CP. I spoke with Dr. Mare Ferrari of [facility] who recommended cycling troponins.  Dr. Hal Hope admitting.  I have reviewed all labs and imaging and considered them in my medical decision  making.   Evelina Bucy, MD 03/24/14 [phone]

## 2014-03-24 NOTE — ED Notes (Signed)
Pt fell while walking in the yard. Denies dizziness. Reports head and neck pain

## 2014-03-24 NOTE — ED Notes (Signed)
Patient asked to change into a gown.  

## 2014-03-24 NOTE — ED Notes (Signed)
Wounds cleaned with wound cleanser and bacitracin applied.

## 2014-03-24 NOTE — ED Notes (Signed)
Report given to Hulan Fess at [facility]

## 2014-03-24 NOTE — ED Notes (Signed)
[facility] here to transfer pt to [facility].

## 2014-03-24 NOTE — H&P (Signed)
[facility] History and Physical  Cheyenne Andrews AUQ:[id number] DOB: 02-20-1933 DOA: 03/24/2014  Referring physician: Evelina Bucy, MD PCP: Gavin Pound, MD   Chief Complaint: Fall  HPI: Cheyenne Andrews is a 81 y.o. female presents after possible syncope and fall. Patient states that she was out working in her yard and fell. She states that she did not completely pass out. This has happened in the past. Patient states that she had no chest pain associated. She staets that she has no shortness of breath. Patient hit her head and has a swellingover her right eye and also an abrasion over her lip area. Patient states that she did not trip over anything. She states that she did not have any dizziness noted. No palpitations noted.   Review of Systems:  Constitutional:  Complete 12 point of ROS performed and was negative   History reviewed. No pertinent past medical history. Past Surgical History  Procedure Laterality Date  . Cataract extraction    . Abdominal hysterectomy     Social History:  reports that she has never smoked. She does not have any smokeless tobacco history on file. She reports that she does not drink alcohol or use illicit drugs.  No Known Allergies  No family history on file.   Prior to Admission medications   Medication Sig Start Date End Date Taking? Authorizing Provider  FLUoxetine (PROZAC) 10 MG capsule Take 10 mg by mouth daily.    Historical Provider, MD  Multiple Vitamin (MULTIVITAMIN) tablet Take 1 tablet by mouth daily.    Historical Provider, MD   Physical Exam: Filed Vitals:   03/24/14 1547 03/24/14 1848 03/24/14 2021 03/24/14 2134  BP: 158/63 172/66 171/76 157/64  Pulse: 66 76 71 71  Temp: 98.1 F (36.7 C)   98.8 F (37.1 C)  TempSrc: Oral   Oral  Resp: 16 16 16 18   Height: 5\' 2"  (1.575 m)   5\' 2"  (1.575 m)  Weight: 51.71 kg (114 lb)   53.3 kg (117 lb 8.1 oz)  SpO2:  98% 98% 93%    Wt Readings from Last 3 Encounters:  03/24/14 53.3  kg (117 lb 8.1 oz)  12/02/12 51.71 kg (114 lb)    General:  Appears calm and comfortable Eyes: PERRL, area above right lid swollen with bruising noted ENT: grossly normal hearing, lips & tongue Neck: no LAD, masses or thyromegaly Cardiovascular: RRR, no m/r/g. No LE edema. Respiratory: CTA bilaterally, no w/r/r. Normal respiratory effort. Abdomen: soft, ntnd Skin: no rash  Musculoskeletal: grossly normal tone BUE/BLE Psychiatric: grossly normal mood and affect, speech fluent and appropriate Neurologic: grossly non-focal.          Labs on Admission:  Basic Metabolic Panel:  Recent Labs Lab 03/24/14 1650  NA 141  K 4.2  CL 107  CO2 23  GLUCOSE 108*  BUN 15  CREATININE 0.84  CALCIUM 9.1   Liver Function Tests: No results for input(s): AST, ALT, ALKPHOS, BILITOT, PROT, ALBUMIN in the last 168 hours. No results for input(s): LIPASE, AMYLASE in the last 168 hours. No results for input(s): AMMONIA in the last 168 hours. CBC:  Recent Labs Lab 03/24/14 1650  WBC 10.0  HGB 14.5  HCT 42.8  MCV 93.2  PLT 202   Cardiac Enzymes:  Recent Labs Lab 03/24/14 1650  TROPONINI 0.05*    BNP (last 3 results) No results for input(s): BNP in the last 8760 hours.  ProBNP (last 3 results) No results for input(s): PROBNP in the  last 8760 hours.  CBG: No results for input(s): GLUCAP in the last 168 hours.  Radiological Exams on Admission: Ct Head Wo Contrast  03/24/2014   CLINICAL DATA:  Fall face first on the concrete. Abrasions to right frontal region. Severe posterior neck pain.  EXAM: CT HEAD WITHOUT CONTRAST  CT CERVICAL SPINE WITHOUT CONTRAST  TECHNIQUE: Multidetector CT imaging of the head and cervical spine was performed following the standard protocol without intravenous contrast. Multiplanar CT image reconstructions of the cervical spine were also generated.  COMPARISON:  CT head 12/02/2012  FINDINGS: CT HEAD FINDINGS  Mild chronic microvascular changes throughout the  deep white matter. No acute intracranial abnormality. Specifically, no hemorrhage, hydrocephalus, mass lesion, acute infarction, or significant intracranial injury. No acute calvarial abnormality. Soft tissue swelling over the right forehead.  Visualized paranasal sinuses and mastoids clear. Orbital soft tissues unremarkable.  CT CERVICAL SPINE FINDINGS  There is a fracture through the base of the odontoid, nondisplaced. No overlying soft tissue swelling. No additional fracture. No epidural or paraspinal hematoma. Normal alignment.  IMPRESSION: Chronic microvascular disease.  No acute intracranial abnormality.  Nondisplaced type 2 odontoid fracture.  Critical Value/emergent results were called by telephone at the time of interpretation on 03/24/2014 at 5:02 pm to Dr. Evelina Bucy , who verbally acknowledged these results.   Electronically Signed   By: Rolm Baptise M.D.   On: 03/24/2014 17:03   Ct Cervical Spine Wo Contrast  03/24/2014   CLINICAL DATA:  Fall face first on the concrete. Abrasions to right frontal region. Severe posterior neck pain.  EXAM: CT HEAD WITHOUT CONTRAST  CT CERVICAL SPINE WITHOUT CONTRAST  TECHNIQUE: Multidetector CT imaging of the head and cervical spine was performed following the standard protocol without intravenous contrast. Multiplanar CT image reconstructions of the cervical spine were also generated.  COMPARISON:  CT head 12/02/2012  FINDINGS: CT HEAD FINDINGS  Mild chronic microvascular changes throughout the deep white matter. No acute intracranial abnormality. Specifically, no hemorrhage, hydrocephalus, mass lesion, acute infarction, or significant intracranial injury. No acute calvarial abnormality. Soft tissue swelling over the right forehead.  Visualized paranasal sinuses and mastoids clear. Orbital soft tissues unremarkable.  CT CERVICAL SPINE FINDINGS  There is a fracture through the base of the odontoid, nondisplaced. No overlying soft tissue swelling. No additional  fracture. No epidural or paraspinal hematoma. Normal alignment.  IMPRESSION: Chronic microvascular disease.  No acute intracranial abnormality.  Nondisplaced type 2 odontoid fracture.  Critical Value/emergent results were called by telephone at the time of interpretation on 03/24/2014 at 5:02 pm to Dr. Evelina Bucy , who verbally acknowledged these results.   Electronically Signed   By: Rolm Baptise M.D.   On: 03/24/2014 17:03      Assessment/Plan Principal Problem:   Syncope Active Problems:   C2 cervical fracture   1. Possible Syncope -will admit for observation -will get an echo -will get a carotid doppler -in addition will check serial enzymes  2. C2 Cervical Fracture -neurosurgery suggested neck collar and an outpatient follow up -no focal deficits noted on exam    Code Status: Full Code (must indicate code status--if unknown or must be presumed, indicate so) DVT Prophylaxis:TEDS Family Communication: None (indicate person spoken with, if applicable, with phone number if by telephone) Disposition Plan: Home (indicate anticipated LOS)  Time spent: 43min  KHAN,SAADAT A [facility] Pager [phone]

## 2014-03-24 NOTE — ED Notes (Signed)
Report given to [facility] with [facility].

## 2014-03-25 DIAGNOSIS — W19XXXS Unspecified fall, sequela: Secondary | ICD-10-CM | POA: Diagnosis not present

## 2014-03-25 DIAGNOSIS — R55 Syncope and collapse: Secondary | ICD-10-CM | POA: Diagnosis not present

## 2014-03-25 DIAGNOSIS — F329 Major depressive disorder, single episode, unspecified: Secondary | ICD-10-CM

## 2014-03-25 DIAGNOSIS — W1839XA Other fall on same level, initial encounter: Secondary | ICD-10-CM | POA: Diagnosis present

## 2014-03-25 DIAGNOSIS — Y92096 Garden or yard of other non-institutional residence as the place of occurrence of the external cause: Secondary | ICD-10-CM | POA: Diagnosis not present

## 2014-03-25 DIAGNOSIS — I10 Essential (primary) hypertension: Secondary | ICD-10-CM | POA: Diagnosis not present

## 2014-03-25 DIAGNOSIS — I5032 Chronic diastolic (congestive) heart failure: Secondary | ICD-10-CM | POA: Diagnosis not present

## 2014-03-25 DIAGNOSIS — I6523 Occlusion and stenosis of bilateral carotid arteries: Secondary | ICD-10-CM | POA: Diagnosis present

## 2014-03-25 DIAGNOSIS — Z79899 Other long term (current) drug therapy: Secondary | ICD-10-CM | POA: Diagnosis not present

## 2014-03-25 DIAGNOSIS — S12100A Unspecified displaced fracture of second cervical vertebra, initial encounter for closed fracture: Secondary | ICD-10-CM | POA: Diagnosis not present

## 2014-03-25 DIAGNOSIS — S12101A Unspecified nondisplaced fracture of second cervical vertebra, initial encounter for closed fracture: Secondary | ICD-10-CM | POA: Diagnosis present

## 2014-03-25 LAB — COMPREHENSIVE METABOLIC PANEL
ALBUMIN: 3.7 g/dL (ref 3.5–5.2)
ALT: 15 U/L (ref 0–35)
ANION GAP: 7 (ref 5–15)
AST: 24 U/L (ref 0–37)
Alkaline Phosphatase: 69 U/L (ref 39–117)
BUN: 11 mg/dL (ref 6–23)
CALCIUM: 8.6 mg/dL (ref 8.4–10.5)
CHLORIDE: 107 mmol/L (ref 96–112)
CO2: 25 mmol/L (ref 19–32)
CREATININE: 0.94 mg/dL (ref 0.50–1.10)
GFR calc Af Amer: 65 mL/min — ABNORMAL LOW (ref >90)
GFR calc non Af Amer: 56 mL/min — ABNORMAL LOW (ref >90)
Glucose, Bld: 111 mg/dL — ABNORMAL HIGH (ref 70–99)
POTASSIUM: 4.3 mmol/L (ref 3.5–5.1)
SODIUM: 139 mmol/L (ref 135–145)
Total Bilirubin: 1.1 mg/dL (ref 0.3–1.2)
Total Protein: 6.3 g/dL (ref 6.0–8.3)

## 2014-03-25 LAB — CBC
HCT: 39.5 % (ref 36.0–46.0)
Hemoglobin: 13.5 g/dL (ref 12.0–15.0)
MCH: 32.1 pg (ref 26.0–34.0)
MCHC: 34.2 g/dL (ref 30.0–36.0)
MCV: 93.8 fL (ref 78.0–100.0)
Platelets: 179 10*3/uL (ref 150–400)
RBC: 4.21 MIL/uL (ref 3.87–5.11)
RDW: 14.1 % (ref 11.5–15.5)
WBC: 7.2 10*3/uL (ref 4.0–10.5)

## 2014-03-25 LAB — TROPONIN I
Troponin I: 0.03 ng/mL (ref <0.031)
Troponin I: <0.03 ng/mL (ref <0.031)
Troponin I: <0.03 ng/mL (ref <0.031)

## 2014-03-25 LAB — TSH: TSH: 1.23 u[IU]/mL (ref 0.350–4.500)

## 2014-03-25 LAB — CREATININE, SERUM
Creatinine, Ser: 0.87 mg/dL (ref 0.50–1.10)
GFR calc Af Amer: 72 mL/min — ABNORMAL LOW (ref >90)
GFR, EST NON AFRICAN AMERICAN: 62 mL/min — AB (ref >90)

## 2014-03-25 LAB — GLUCOSE, CAPILLARY: GLUCOSE-CAPILLARY: 94 mg/dL (ref 70–99)

## 2014-03-25 NOTE — Evaluation (Signed)
Physical Therapy Evaluation Patient Details Name: Cheyenne Andrews MRN: [id number] DOB: October 01, 1933 Today's Date: 03/25/2014   History of Present Illness  81 y.o. female admitted after syncopal episode with fall resulting in C2 odontoid type II fracture.  Clinical Impression  Pt admitted with the above complications. Pt currently with functional limitations due to the deficits listed below (see PT Problem List). Minor balance deficits noted while ambulating, pt declines use of a rolling walker but would benefit from cane for support. Family very supportive and daughters will provide 24 hour care at home. Safely completed stair training and practiced safe bed mobility and donning/doffing cervical collars with patient and daughter several times during therapy session. Pt will benefit from skilled PT to increase their independence and safety with mobility to allow discharge to the venue listed below.       Follow Up Recommendations Home health PT    Equipment Recommendations  Cane    Recommendations for Other Services OT consult     Precautions / Restrictions Precautions Precautions: Cervical Precaution Comments: reviewed Required Braces or Orthoses: Cervical Brace ([place] and [place] collars) Cervical Brace: Hard collar;At all times;Other (comment) (switch collars to shower IN BED) Restrictions Weight Bearing Restrictions: No      Mobility  Bed Mobility Overal bed mobility: Needs Assistance Bed Mobility: Rolling;Sidelying to Sit;Sit to Sidelying Rolling: Min assist Sidelying to sit: Min assist     Sit to sidelying: Min assist General bed mobility comments: Min assist to facilitate desired movement for log roll technique. Practiced rolling x4 to don/doff cervial collars while educating patient and daughter on safe techniques.  Transfers Overall transfer level: Needs assistance Equipment used: None Transfers: Sit to/from Stand Sit to Stand: Supervision         General  transfer comment: supervision for safety. no physical assist needed  Ambulation/Gait Ambulation/Gait assistance: Supervision Ambulation Distance (Feet): 150 Feet Assistive device: None Gait Pattern/deviations: Step-through pattern;Decreased stride length;Staggering right;Drifts right/left;Narrow base of support Gait velocity: decreased   General Gait Details: Drifts towards her right, sometimes coming close to bumping into wall. Cues for better awareness. Declines use of a rolling walker. No buckling noted. No dizziness while ambulating. Mild staggering at times but did not need physical assist to correct.  Stairs Stairs: Yes Stairs assistance: Supervision Stair Management: One rail Left;Step to pattern;Forwards Number of Stairs: 4 General stair comments: educated on safe stair navigation with single rail similar to home environment. States she feels confident with this task and daughter was present and observed to participate in training.  Wheelchair Mobility    Modified Rankin (Stroke Patients Only)       Balance Overall balance assessment: Needs assistance;History of Falls Sitting-balance support: No upper extremity supported;Feet supported Sitting balance-Leahy Scale: Normal     Standing balance support: No upper extremity supported Standing balance-Leahy Scale: Fair             R[place]berg - Eyes Closed: 0.15 (loses balance posteriorly)                 Pertinent Vitals/Pain Pain Assessment: 0-10 Pain Score: 1  Pain Location: neck Pain Descriptors / Indicators: Dull Pain Intervention(s): Limited activity within patient's tolerance;Premedicated before session;Repositioned    Home Living Family/patient expects to be discharged to:: Private residence Living Arrangements: Alone Available Help at Discharge: Family;Available 24 hours/day Type of Home: House Home Access: Stairs to enter Entrance Stairs-Rails: Left Entrance Stairs-Number of Steps: 2 Home  Layout: One level Home Equipment: None  Prior Function Level of Independence: Independent               Hand Dominance   Dominant Hand: Right    Extremity/Trunk Assessment   Upper Extremity Assessment: Defer to OT evaluation           Lower Extremity Assessment: Overall WFL for tasks assessed      Cervical / Trunk Assessment: Other exceptions (In collar at all times)  Communication   Communication: No difficulties  Cognition Arousal/Alertness: Awake/alert Behavior During Therapy: WFL for tasks assessed/performed Overall Cognitive Status: Within Functional Limits for tasks assessed                      General Comments General comments (skin integrity, edema, etc.): Patient and family had many questions concerning safety with mobility, discharge planning, PT follow up, and use of cervical collars. All information was answered and daughter actively participated in donning/doffing cervical collar on patient while in bed.    Exercises        Assessment/Plan    PT Assessment Patient needs continued PT services  PT Diagnosis Difficulty walking;Abnormality of gait;Acute pain   PT Problem List Decreased range of motion;Decreased activity tolerance;Decreased balance;Decreased mobility;Decreased coordination;Decreased knowledge of use of DME;Decreased knowledge of precautions;Pain  PT Treatment Interventions DME instruction;Gait training;Stair training;Functional mobility training;Therapeutic activities;Therapeutic exercise;Balance training;Neuromuscular re-education;Patient/family education;Modalities   PT Goals (Current goals can be found in the Care Plan section) Acute Rehab PT Goals Patient Stated Goal: Go home PT Goal Formulation: With patient Time For Goal Achievement: 04/08/14 Potential to Achieve Goals: Good    Frequency Min 5X/week   Barriers to discharge Decreased caregiver support lives alone but will have temporary 24 hour care from  daughters    Co-evaluation               End of Session Equipment Utilized During Treatment: Gait belt Activity Tolerance: Patient tolerated treatment well Patient left: in bed;with call bell/phone within reach;with family/visitor present Nurse Communication: Mobility status         Time: 1730-1829 PT Time Calculation (min) (ACUTE ONLY): 59 min   Charges:   PT Evaluation $Initial PT Evaluation Tier I: 1 Procedure PT Treatments $Gait Training: 8-22 mins $Therapeutic Activity: 8-22 mins $Self Care/Home Management: 8-22   PT G Codes:        Ellouise Newer 03/25/2014, 6:43 PM Camille Bal [place], [place]

## 2014-03-25 NOTE — Progress Notes (Signed)
UR completed 

## 2014-03-25 NOTE — Progress Notes (Addendum)
[facility] PROGRESS NOTE  Cheyenne Andrews BWI:[id number] DOB: Apr 18, 1933 DOA: 03/24/2014 PCP: Gavin Pound, MD  Assessment/Plan: 1-syncope: Etiology unclear at this moment. -No abnormalities has been seen on telemetry -serial troponin negative -2-D echo and carotid Dopplers pending -TSH within normal limits -No orthostatic VS abnormalities -Will continue supportive care, follow physical therapy and occupational therapy recommendations -If further events persist/record patient will benefit of event monitoring in the outpatient setting.  2-C2 fracture: Case discussed with neurosurgery -No acute intervention planned at this moment -Continue neck collar (rigid and philadelphia collar) 24/7 and follow with neurosurgery service in 1-2 weeks as an outpatient  3-depression: Continue prozac  Code Status: Full code Family Communication: No family at bedside Disposition Plan: Most likely discharge in a.m. after syncope workup completed   Consultants:  Neurosurgery: (Dr. Christella Noa; has recommended the use of [place] cervical collar along with rigid neck collar to keep immobilization 24/7 and to follow with him in 1-2 week).   Procedures:  2-D echo: Pending  Carotid Dopplers: Pending  Antibiotics:  None  HPI/Subjective: Afebrile, denies chest pain, no shortness of breath. Patient reports feeling okay and currently without any major complaints. Mild discomfort from neck collar reported  Objective: Filed Vitals:   03/25/14 1357  BP: 165/66  Pulse: 66  Temp: 97.6 F (36.4 C)  Resp: 18   No intake or output data in the 24 hours ending 03/25/14 1553 Filed Weights   03/24/14 1547 03/24/14 2134  Weight: 51.71 kg (114 lb) 53.3 kg (117 lb 8.1 oz)    Exam:   General:  Feeling okay, no further episodes of dizziness, lightheadedness or syncope; denies chest pain, shortness of breath and her orthostatics vital signs are negative. Complaining of discomfort from neck  collar  Cardiovascular: S1 and S2 appreciated, no rubs, no murmurs, no gallops; no JVD.   Respiratory: Clear to auscultation bilaterally   Abdomen: Soft, nontender, nondistended, positive bowel sounds  Musculoskeletal: No edema, no cyanosis or clubbing  Data Reviewed: Basic Metabolic Panel:  Recent Labs Lab 03/24/14 1650 03/24/14 2325 03/25/14 0405  NA 141  --  139  K 4.2  --  4.3  CL 107  --  107  CO2 23  --  25  GLUCOSE 108*  --  111*  BUN 15  --  11  CREATININE 0.84 0.87 0.94  CALCIUM 9.1  --  8.6   Liver Function Tests:  Recent Labs Lab 03/25/14 0405  AST 24  ALT 15  ALKPHOS 69  BILITOT 1.1  PROT 6.3  ALBUMIN 3.7   CBC:  Recent Labs Lab 03/24/14 1650 03/24/14 2325 03/25/14 0405  WBC 10.0 7.9 7.2  HGB 14.5 14.1 13.5  HCT 42.8 41.4 39.5  MCV 93.2 91.4 93.8  PLT 202 192 179   Cardiac Enzymes:  Recent Labs Lab 03/24/14 1650 03/24/14 2325 03/25/14 0405 03/25/14 1045  TROPONINI 0.05* 0.03 <0.03 <0.03   CBG:  Recent Labs Lab 03/25/14 0551  GLUCAP 94    Studies: Ct Head Wo Contrast  03/24/2014   CLINICAL DATA:  Fall face first on the concrete. Abrasions to right frontal region. Severe posterior neck pain.  EXAM: CT HEAD WITHOUT CONTRAST  CT CERVICAL SPINE WITHOUT CONTRAST  TECHNIQUE: Multidetector CT imaging of the head and cervical spine was performed following the standard protocol without intravenous contrast. Multiplanar CT image reconstructions of the cervical spine were also generated.  COMPARISON:  CT head 12/02/2012  FINDINGS: CT HEAD FINDINGS  Mild chronic microvascular changes throughout the  deep white matter. No acute intracranial abnormality. Specifically, no hemorrhage, hydrocephalus, mass lesion, acute infarction, or significant intracranial injury. No acute calvarial abnormality. Soft tissue swelling over the right forehead.  Visualized paranasal sinuses and mastoids clear. Orbital soft tissues unremarkable.  CT CERVICAL SPINE FINDINGS   There is a fracture through the base of the odontoid, nondisplaced. No overlying soft tissue swelling. No additional fracture. No epidural or paraspinal hematoma. Normal alignment.  IMPRESSION: Chronic microvascular disease.  No acute intracranial abnormality.  Nondisplaced type 2 odontoid fracture.  Critical Value/emergent results were called by telephone at the time of interpretation on 03/24/2014 at 5:02 pm to Dr. Evelina Bucy , who verbally acknowledged these results.   Electronically Signed   By: Rolm Baptise M.D.   On: 03/24/2014 17:03   Ct Cervical Spine Wo Contrast  03/24/2014   CLINICAL DATA:  Fall face first on the concrete. Abrasions to right frontal region. Severe posterior neck pain.  EXAM: CT HEAD WITHOUT CONTRAST  CT CERVICAL SPINE WITHOUT CONTRAST  TECHNIQUE: Multidetector CT imaging of the head and cervical spine was performed following the standard protocol without intravenous contrast. Multiplanar CT image reconstructions of the cervical spine were also generated.  COMPARISON:  CT head 12/02/2012  FINDINGS: CT HEAD FINDINGS  Mild chronic microvascular changes throughout the deep white matter. No acute intracranial abnormality. Specifically, no hemorrhage, hydrocephalus, mass lesion, acute infarction, or significant intracranial injury. No acute calvarial abnormality. Soft tissue swelling over the right forehead.  Visualized paranasal sinuses and mastoids clear. Orbital soft tissues unremarkable.  CT CERVICAL SPINE FINDINGS  There is a fracture through the base of the odontoid, nondisplaced. No overlying soft tissue swelling. No additional fracture. No epidural or paraspinal hematoma. Normal alignment.  IMPRESSION: Chronic microvascular disease.  No acute intracranial abnormality.  Nondisplaced type 2 odontoid fracture.  Critical Value/emergent results were called by telephone at the time of interpretation on 03/24/2014 at 5:02 pm to Dr. Evelina Bucy , who verbally acknowledged these results.    Electronically Signed   By: Rolm Baptise M.D.   On: 03/24/2014 17:03    Scheduled Meds: . aspirin EC  325 mg Oral Daily  . FLUoxetine  10 mg Oral Daily  . folic acid  1 mg Oral Daily  . heparin  5,000 Units Subcutaneous Q8H  . multivitamin with minerals  1 tablet Oral Daily  . sodium chloride  3 mL Intravenous Q12H  . thiamine  100 mg Oral Daily   Continuous Infusions: . sodium chloride 75 mL/hr at 03/24/14 2239    Principal Problem:   Syncope Active Problems:   C2 cervical fracture    Time spent: 30 minutes    Barton Dubois  [facility] Pager [phone]. If 7PM-7AM, please contact night-coverage at [email or web address], password [facility] 03/25/2014, 3:53 PM  LOS: 1 day

## 2014-03-25 NOTE — Progress Notes (Signed)
Orthopedic Tech Progress Note Patient Details:  Cheyenne Andrews 01-23-1933 [id number]  Ortho Devices Type of Ortho Device: [place] cervical collar Ortho Device/Splint Location: neck Ortho Device/Splint Interventions: [name], [name] 03/25/2014, 1:14 PM

## 2014-03-25 NOTE — Progress Notes (Signed)
*  PRELIMINARY RESULTS* Vascular Ultrasound Carotid Duplex (Doppler) has been completed.  Preliminary findings: Bilateral:  1-39% ICA stenosis.  Vertebral artery flow is antegrade.      Landry Mellow, RDMS, RVT  03/25/2014, 4:32 PM

## 2014-03-26 DIAGNOSIS — I10 Essential (primary) hypertension: Secondary | ICD-10-CM | POA: Insufficient documentation

## 2014-03-26 DIAGNOSIS — W19XXXS Unspecified fall, sequela: Secondary | ICD-10-CM

## 2014-03-26 DIAGNOSIS — R55 Syncope and collapse: Secondary | ICD-10-CM | POA: Insufficient documentation

## 2014-03-26 DIAGNOSIS — W19XXXA Unspecified fall, initial encounter: Secondary | ICD-10-CM | POA: Insufficient documentation

## 2014-03-26 DIAGNOSIS — I5032 Chronic diastolic (congestive) heart failure: Secondary | ICD-10-CM | POA: Insufficient documentation

## 2014-03-26 LAB — HEMOGLOBIN A1C
Hgb A1c MFr Bld: 5.3 % (ref 4.8–5.6)
Mean Plasma Glucose: 105 mg/dL

## 2014-03-26 LAB — GLUCOSE, CAPILLARY: Glucose-Capillary: 92 mg/dL (ref 70–99)

## 2014-03-26 MED ORDER — ACETAMINOPHEN 500 MG PO TABS: 500.0000 mg | ORAL_TABLET | Freq: Four times a day (QID) | ORAL | Status: DC | PRN

## 2014-03-26 MED ORDER — ASPIRIN EC 81 MG PO TBEC: 81.0000 mg | DELAYED_RELEASE_TABLET | Freq: Every day | ORAL | Status: DC

## 2014-03-26 NOTE — Progress Notes (Signed)
  Echocardiogram 2D Echocardiogram has been performed.  Cheyenne Andrews 03/26/2014, 9:32 AM

## 2014-03-26 NOTE — Progress Notes (Signed)
Physical Therapy Treatment Patient Details Name: September Mormile MRN: [id number] DOB: [birth year] Today's Date: 2014/04/18    History of Present Illness 81 y.o. female admitted after syncopal episode with fall resulting in C2 odontoid type II fracture.    PT Comments    Pt and dgtr present throughout session with extensive education for collar wear, switching collars for bathing, transfers, ADLs, safety, body mechanics, positioning for daily activities and computer use. Handout for cervical precautions given and all questions answered. Pt somewhat anxious and fearful about mobility but reported increased ease and comfort after session with all education. Will continue to follow.   Follow Up Recommendations  Home health PT     Equipment Recommendations       Recommendations for Other Services       Precautions / Restrictions Precautions Precautions: Cervical Required Braces or Orthoses: Cervical Brace Cervical Brace: Hard collar;At all times;Other (comment)    Mobility  Bed Mobility Overal bed mobility: Needs Assistance Bed Mobility: Rolling;Sidelying to Sit;Sit to Sidelying Rolling: Min assist Sidelying to sit: Min assist     Sit to sidelying: Min assist General bed mobility comments: cues for sequence and precautions with practice x 2 each  Transfers Overall transfer level: Modified independent                  Ambulation/Gait Ambulation/Gait assistance: Modified independent (Device/Increase time) Ambulation Distance (Feet): 350 Feet Assistive device: None Gait Pattern/deviations: Step-through pattern;Decreased stride length         Stairs            Wheelchair Mobility    Modified Rankin (Stroke Patients Only)       Balance                                    Cognition Arousal/Alertness: Awake/alert Behavior During Therapy: Anxious Overall Cognitive Status: Within Functional Limits for tasks assessed                       Exercises      General Comments        Pertinent Vitals/Pain Pain Assessment: No/denies pain    Home Living                      Prior Function            PT Goals (current goals can now be found in the care plan section) Progress towards PT goals: Progressing toward goals    Frequency  Min 3X/week    PT Plan Frequency needs to be updated;Current plan remains appropriate    Co-evaluation             End of Session Equipment Utilized During Treatment: Cervical collar Activity Tolerance: Patient tolerated treatment well Patient left: in chair;with call bell/phone within reach;with family/visitor present;with nursing/sitter in room     Time: 1021-1106 PT Time Calculation (min) (ACUTE ONLY): 45 min  Charges:  $Gait Training: 8-22 mins $Therapeutic Activity: 8-22 mins $Self Care/Home Management: 8-22                    G Codes:      Melford Aase 04-18-2014, 11:23 AM Elwyn Reach, [place]

## 2014-03-26 NOTE — Care Management Note (Signed)
    Page 1 of 1   03/26/2014     2:36:50 PM CARE MANAGEMENT NOTE 03/26/2014  Patient:  Cheyenne Andrews, Cheyenne Andrews   Account Number:  [id number]  Date Initiated:  03/26/2014  Documentation initiated by:  Elissa Hefty  Subjective/Objective Assessment:   adm after fall from syncope, in neck collar     Action/Plan:   lives alone but very supp family. 1 da lives close   Anticipated DC Date:  03/26/2014   Anticipated DC Plan:  [place]  CM consult      [facility] Choice  HOME HEALTH   Choice offered to / List presented to:  C-1 Patient        [place] arranged  [place].   Status of service:   Medicare Important Message given?  NA - LOS <3 / Initial given by admissions (If response is "NO", the following Medicare IM given date fields will be blank) Date Medicare IM given:   Medicare IM given by:   Date Additional Medicare IM given:   Additional Medicare IM given by:    Discharge Disposition:  [place]  Per UR Regulation:  Reviewed for med. necessity/level of care/duration of stay  If discussed at [place] of Stay Meetings, dates discussed:    Comments:  3/11 1435 debbie Britney Captain rn,bsn spoke w pt and da. went over hhc agncy list. they chose [facility]. ref to [facility] for hhpt and hhot. for dc home today. no eq needed per pt.da and other fam will be in and out to assist also.

## 2014-03-26 NOTE — Discharge Summary (Addendum)
Physician Discharge Summary  Cheyenne Andrews MVE:[id number] DOB: 1933/10/19 DOA: 03/24/2014  PCP: Gavin Pound, MD  Admit date: 03/24/2014 Discharge date: 03/26/2014  Time spent: >30 minutes  Recommendations for Outpatient Follow-up:  1. Reassess BP and if needed start antihypertensive regimen 2. Follow BMET to assess electrolytes and renal function 3. Patient will follow with neurosurgery in 1 week after discharge for further evaluation and treatment from C2 fracture.  Discharge Diagnoses:  Principal Problem:   Syncope Active Problems:   C2 cervical fracture Bilateral ICA stenosis Chronic grade 1 diastolic dysfunction Essential HTN  Discharge Condition: stable and improved. Will follow with PCP in 10 days and with neurosurgery in 1 week  Diet recommendation: heart healthy diet  Filed Weights   03/24/14 1547 03/24/14 2134  Weight: 51.71 kg (114 lb) 53.3 kg (117 lb 8.1 oz)    History of present illness:  81 y.o. female presented after possible syncope and fall. Patient states that she was out working in her yard and fell. She states that she did not completely pass out. This has happened in the past. Patient states that she had no chest pain associated. She staets that she has no shortness of breath. Patient hit her head and has a swellingover her right eye and also an abrasion over her lip area. Patient states that she did not trip over anything. She states that she did not have any dizziness noted. No palpitations noted.  Hospital Course:  1-near syncope/syncope: Etiology unclear at this moment. -No abnormalities has been seen on telemetry -serial troponin negative -2-D echo and carotid Dopplers no demonstrating any abnormalities to explain falling/near syncope episode -TSH within normal limits -No orthostatic VS abnormalities -If further events persist/record patient will benefit of event monitoring in the outpatient setting.  2-C2 fracture: Case discussed with  neurosurgery -No acute intervention planned at this moment -Continue neck collar (rigid and [place] collar) 24/7 and follow with neurosurgery service in 1 week as an outpatient  3-depression: Continue prozac  4-elevated BP: patient w/o prior hx of HTN -in stress situation and pain from fall will not initiate antihypertensive therapy -patient advise to follow heart healthy low sodium diet and to take pain meds -will follow with PCP in 10 days and if still elevated by them will require initiation of antihypertensive medications  5-mild ICA stenosis bilaterally: patient will be started on baby aspirin -also advise to follow heart healthy diet   6-grade 1 diastolic dysfunction: chronic and compensated -advise to follow heart healthy diet  Procedures: 2-D echo - Left ventricle: The cavity size was normal. Systolic function was vigorous. The estimated ejection fraction was in the range of 65% to 70%. Wall motion was normal; there were no regional wall motion abnormalities. Doppler parameters are consistent with abnormal left ventricular relaxation (grade 1 diastolic dysfunction). - Pulmonary arteries: Systolic pressure was mildly increased. PA peak pressure: 36 mm Hg (S).  Carotid duplex: Bilateral: 1-39% ICA stenosis. Vertebral artery flow is antegrade.   Consultations:  None   Discharge Exam: Filed Vitals:   03/26/14 0330  BP: 158/55  Pulse: 64  Temp: 98.6 F (37 C)  Resp: 18    General: Feeling okay, no further episodes of dizziness, lightheadedness or syncope; denies chest pain, shortness of breath. Neg orthostatic changes. Bruises in forehead and above right eyelid after falling, healing already. Reports mild pain in her neck.  Cardiovascular: S1 and S2, no rubs, no murmurs, no gallops; no JVD.   Respiratory: Clear to auscultation bilaterally  Abdomen: Soft, nontender, nondistended, positive bowel sounds  Musculoskeletal: No edema, no  cyanosis or clubbing   Discharge Instructions   Discharge Instructions    Diet - low sodium heart healthy    Complete by:  As directed      Discharge instructions    Complete by:  As directed   Take medications as prescribed Maintain good hydration Please follow with PCP in 10 days Follow a heart healthy diet (less than 2.5 Grams daily) Increase activity slowly Follow up with Dr. Christella Noa in 1 week (call office for appointment details) Keep neck collar 24/7 (interchange with [place] collar as instructed to take a shower)     Increase activity slowly    Complete by:  As directed           Current Discharge Medication List    START taking these medications   Details  acetaminophen (TYLENOL) 500 MG tablet Take 1 tablet (500 mg total) by mouth every 6 (six) hours as needed for moderate pain (or Fever >/= 101).    aspirin EC 81 MG tablet Take 1 tablet (81 mg total) by mouth daily. Qty: 30 tablet, Refills: 1      CONTINUE these medications which have NOT CHANGED   Details  Cholecalciferol (VITAMIN D-3) 1000 UNITS CAPS Take 1 capsule by mouth 2 (two) times daily.    FLUoxetine (PROZAC) 10 MG capsule Take 10 mg by mouth daily.    Hypromellose (GENTEAL MILD OP) Apply 1-2 drops to eye 3 (three) times daily as needed (for eye dryness).    ibandronate (BONIVA) 150 MG tablet Take 150 mg by mouth every 30 (thirty) days. Take in the morning with a full glass of water, on an empty stomach, and do not take anything else by mouth or lie down for the next 30 min.    Multiple Vitamin (MULTIVITAMIN) tablet Take 1 tablet by mouth daily.      STOP taking these medications     Probiotic Product (PROBIOTIC DAILY PO)        No Known Allergies Follow-up Information    Follow up with NNODI, ADAKU, MD. Schedule an appointment as soon as possible for a visit in 2 weeks.   Specialty:  Family Medicine   Contact information:   [place] [address] [phone]        Follow up with CABBELL,KYLE L, MD. Call in 1 week.   Specialty:  Neurosurgery   Why:  call office for appointment details (per Dr. Christella Noa needs follow up with him in 1 week)   Contact information:   [address] [place] [address] [phone]        The results of significant diagnostics from this hospitalization (including imaging, microbiology, ancillary and laboratory) are listed below for reference.    Significant Diagnostic Studies: Ct Head Wo Contrast  03/24/2014   CLINICAL DATA:  Fall face first on the concrete. Abrasions to right frontal region. Severe posterior neck pain.  EXAM: CT HEAD WITHOUT CONTRAST  CT CERVICAL SPINE WITHOUT CONTRAST  TECHNIQUE: Multidetector CT imaging of the head and cervical spine was performed following the standard protocol without intravenous contrast. Multiplanar CT image reconstructions of the cervical spine were also generated.  COMPARISON:  CT head 12/02/2012  FINDINGS: CT HEAD FINDINGS  Mild chronic microvascular changes throughout the deep white matter. No acute intracranial abnormality. Specifically, no hemorrhage, hydrocephalus, mass lesion, acute infarction, or significant intracranial injury. No acute calvarial abnormality. Soft tissue  swelling over the right forehead.  Visualized paranasal sinuses and mastoids clear. Orbital soft tissues unremarkable.  CT CERVICAL SPINE FINDINGS  There is a fracture through the base of the odontoid, nondisplaced. No overlying soft tissue swelling. No additional fracture. No epidural or paraspinal hematoma. Normal alignment.  IMPRESSION: Chronic microvascular disease.  No acute intracranial abnormality.  Nondisplaced type 2 odontoid fracture.  Critical Value/emergent results were called by telephone at the time of interpretation on 03/24/2014 at 5:02 pm to Dr. Evelina Bucy , who verbally acknowledged these results.   Electronically Signed   By: Rolm Baptise M.D.   On: 03/24/2014 17:03   Ct  Cervical Spine Wo Contrast  03/24/2014   CLINICAL DATA:  Fall face first on the concrete. Abrasions to right frontal region. Severe posterior neck pain.  EXAM: CT HEAD WITHOUT CONTRAST  CT CERVICAL SPINE WITHOUT CONTRAST  TECHNIQUE: Multidetector CT imaging of the head and cervical spine was performed following the standard protocol without intravenous contrast. Multiplanar CT image reconstructions of the cervical spine were also generated.  COMPARISON:  CT head 12/02/2012  FINDINGS: CT HEAD FINDINGS  Mild chronic microvascular changes throughout the deep white matter. No acute intracranial abnormality. Specifically, no hemorrhage, hydrocephalus, mass lesion, acute infarction, or significant intracranial injury. No acute calvarial abnormality. Soft tissue swelling over the right forehead.  Visualized paranasal sinuses and mastoids clear. Orbital soft tissues unremarkable.  CT CERVICAL SPINE FINDINGS  There is a fracture through the base of the odontoid, nondisplaced. No overlying soft tissue swelling. No additional fracture. No epidural or paraspinal hematoma. Normal alignment.  IMPRESSION: Chronic microvascular disease.  No acute intracranial abnormality.  Nondisplaced type 2 odontoid fracture.  Critical Value/emergent results were called by telephone at the time of interpretation on 03/24/2014 at 5:02 pm to Dr. Evelina Bucy , who verbally acknowledged these results.   Electronically Signed   By: Rolm Baptise M.D.   On: 03/24/2014 17:03   Labs: Basic Metabolic Panel:  Recent Labs Lab 03/24/14 1650 03/24/14 2325 03/25/14 0405  NA 141  --  139  K 4.2  --  4.3  CL 107  --  107  CO2 23  --  25  GLUCOSE 108*  --  111*  BUN 15  --  11  CREATININE 0.84 0.87 0.94  CALCIUM 9.1  --  8.6   Liver Function Tests:  Recent Labs Lab 03/25/14 0405  AST 24  ALT 15  ALKPHOS 69  BILITOT 1.1  PROT 6.3  ALBUMIN 3.7   CBC:  Recent Labs Lab 03/24/14 1650 03/24/14 2325 03/25/14 0405  WBC 10.0 7.9 7.2   HGB 14.5 14.1 13.5  HCT 42.8 41.4 39.5  MCV 93.2 91.4 93.8  PLT 202 192 179   Cardiac Enzymes:  Recent Labs Lab 03/24/14 1650 03/24/14 2325 03/25/14 0405 03/25/14 1045  TROPONINI 0.05* 0.03 <0.03 <0.03   CBG:  Recent Labs Lab 03/25/14 0551 03/26/14 0556  GLUCAP 94 92    Signed:  Barton Dubois  [facility] 03/26/2014, 2:40 PM

## 2014-03-26 NOTE — Evaluation (Signed)
Occupational Therapy Evaluation Patient Details Name: Cheyenne Andrews MRN: [id number] DOB: 05/23/1933 Today's Date: 03/26/2014    History of Present Illness 81 y.o. female admitted after syncopal episode with fall resulting in C2 odontoid type II fracture.   Clinical Impression   Patient evaluated by Occupational Therapy with no further acute OT needs identified. All education has been completed and the patient has no further questions. All education completed.  See below for any follow-up Occupational Therapy or equipment needs. OT is signing off. Thank you for this referral.      Follow Up Recommendations  No OT follow up;Supervision/Assistance - 24 hour    Equipment Recommendations  None recommended by OT    Recommendations for Other Services       Precautions / Restrictions Precautions Precautions: Cervical Precaution Comments: Pt able to independently state precautions  Required Braces or Orthoses: Cervical Brace ([place] and [place] collars) Cervical Brace: Hard collar;At all times;Other (comment) (switch collars to shower IN BED)      Mobility Bed Mobility Overal bed mobility: Needs Assistance Bed Mobility: Rolling;Sidelying to Sit;Sit to Sidelying Rolling: Min assist Sidelying to sit: Min assist     Sit to sidelying: Min assist General bed mobility comments: Pt requires cues for sequencing and technique.  Pt with increased pain when moving sidelying to sit.  Discussed option of sleeping in recliner initially   Transfers Overall transfer level: Modified independent                    Balance           Standing balance support: During functional activity Standing balance-Leahy Scale: Fair                              ADL Overall ADL's : Needs assistance/impaired Eating/Feeding: Modified independent;Sitting   Grooming: Wash/dry hands;Wash/dry face;Oral care;Brushing hair;Supervision/safety;Standing Grooming Details (indicate cue type  and reason): Pt is able to verbalize safe method for oral care to avoid bending forward  Upper Body Bathing: Supervision/ safety;Sitting   Lower Body Bathing: Supervison/ safety;Sit to/from stand   Upper Body Dressing : Supervision/safety;Sitting   Lower Body Dressing: Supervision/safety;Sit to/from stand   Toilet Transfer: Supervision/safety;Ambulation;Comfort height toilet   Toileting- Clothing Manipulation and Hygiene: Supervision/safety;Sit to/from stand   Tub/ Shower Transfer: Walk-in shower;Supervision/safety;Ambulation Tub/Shower Transfer Details (indicate cue type and reason): discussed options for shower seats Functional mobility during ADLs: Supervision/safety General ADL Comments: Reviewed safe technique for UB and  LB ADLs (she was able to recall info provided by PT).  reviewed safety for simple meal prep.  Discussed acquiring a microwave and placing it on counter as her microwave is overhead.  Cautioned her to avoide using a step stool.  Instructed her to have assist with laundry, bed linen changes, vacuuming, cleaning.   Discussed use of reacher (she has one), and need to have her cell phone (or life alert) with her at all times.      Vision     Perception     Praxis      Pertinent Vitals/Pain Pain Assessment: Faces Faces Pain Scale: Hurts little more Pain Location: neck with bed mobility  Pain Descriptors / Indicators: Aching Pain Intervention(s): Monitored during session;Repositioned     Hand Dominance Right   Extremity/Trunk Assessment Upper Extremity Assessment Upper Extremity Assessment: Overall WFL for tasks assessed   Lower Extremity Assessment Lower Extremity Assessment: Defer to PT evaluation   Cervical / Trunk Assessment  Cervical / Trunk Assessment: Other exceptions Cervical / Trunk Exceptions: Pt to be in cervical collar at all times    Communication Communication Communication: No difficulties   Cognition Arousal/Alertness:  Awake/alert Behavior During Therapy: WFL for tasks assessed/performed Overall Cognitive Status: Within Functional Limits for tasks assessed                     General Comments       Exercises       Shoulder Instructions      Home Living Family/patient expects to be discharged to:: Private residence Living Arrangements: Alone Available Help at Discharge: Family;Available 24 hours/day Type of Home: House Home Access: Stairs to enter [employer] of Steps: 2 Entrance Stairs-Rails: Left Home Layout: One level     Bathroom Shower/Tub: Occupational psychologist: Standard     Home Equipment: None          Prior Functioning/Environment Level of Independence: Independent             OT Diagnosis: Generalized weakness;Acute pain   OT Problem List:     OT Treatment/Interventions:      OT Goals(Current goals can be found in the care plan section) Acute Rehab OT Goals Patient Stated Goal: Go home OT Goal Formulation: All assessment and education complete, DC therapy  OT Frequency:     Barriers to D/C:            Co-evaluation              End of Session Equipment Utilized During Treatment: Cervical collar  Activity Tolerance: Patient tolerated treatment well Patient left: in bed;with call bell/phone within reach;with family/visitor present   Time: 1898-4210 OT Time Calculation (min): 21 min Charges:  OT General Charges $OT Visit: 1 Procedure OT Evaluation $Initial OT Evaluation Tier I: 1 Procedure G-Codes:    Lucille Passy M Mar 28, 2014, 3:51 PM

## 2014-04-05 DIAGNOSIS — S12112A Nondisplaced Type II dens fracture, initial encounter for closed fracture: Secondary | ICD-10-CM | POA: Diagnosis not present

## 2014-04-05 DIAGNOSIS — I10 Essential (primary) hypertension: Secondary | ICD-10-CM | POA: Diagnosis not present

## 2014-04-14 DIAGNOSIS — R030 Elevated blood-pressure reading, without diagnosis of hypertension: Secondary | ICD-10-CM | POA: Diagnosis not present

## 2014-04-14 DIAGNOSIS — S12151A Other traumatic nondisplaced spondylolisthesis of second cervical vertebra, initial encounter for closed fracture: Secondary | ICD-10-CM | POA: Diagnosis not present

## 2014-05-03 DIAGNOSIS — R030 Elevated blood-pressure reading, without diagnosis of hypertension: Secondary | ICD-10-CM | POA: Diagnosis not present

## 2014-05-03 DIAGNOSIS — S12100D Unspecified displaced fracture of second cervical vertebra, subsequent encounter for fracture with routine healing: Secondary | ICD-10-CM | POA: Diagnosis not present

## 2014-05-24 ENCOUNTER — Other Ambulatory Visit: Payer: Self-pay | Admitting: Neurosurgery

## 2014-05-24 DIAGNOSIS — S12100D Unspecified displaced fracture of second cervical vertebra, subsequent encounter for fracture with routine healing: Secondary | ICD-10-CM

## 2014-06-01 ENCOUNTER — Ambulatory Visit
Admission: RE | Admit: 2014-06-01 | Discharge: 2014-06-01 | Disposition: A | Discharge status: Home / Self Care | Payer: Medicare Other | Source: Ambulatory Visit | Attending: Neurosurgery | Admitting: Neurosurgery

## 2014-06-01 DIAGNOSIS — R030 Elevated blood-pressure reading, without diagnosis of hypertension: Secondary | ICD-10-CM | POA: Diagnosis not present

## 2014-06-01 DIAGNOSIS — S12100D Unspecified displaced fracture of second cervical vertebra, subsequent encounter for fracture with routine healing: Secondary | ICD-10-CM | POA: Diagnosis not present

## 2014-07-01 DIAGNOSIS — E860 Dehydration: Secondary | ICD-10-CM | POA: Diagnosis not present

## 2014-07-01 DIAGNOSIS — F430 Acute stress reaction: Secondary | ICD-10-CM | POA: Diagnosis not present

## 2014-07-15 DIAGNOSIS — S12100D Unspecified displaced fracture of second cervical vertebra, subsequent encounter for fracture with routine healing: Secondary | ICD-10-CM | POA: Diagnosis not present

## 2014-07-15 DIAGNOSIS — R030 Elevated blood-pressure reading, without diagnosis of hypertension: Secondary | ICD-10-CM | POA: Diagnosis not present

## 2014-08-09 DIAGNOSIS — S12100D Unspecified displaced fracture of second cervical vertebra, subsequent encounter for fracture with routine healing: Secondary | ICD-10-CM | POA: Diagnosis not present

## 2014-08-09 DIAGNOSIS — R030 Elevated blood-pressure reading, without diagnosis of hypertension: Secondary | ICD-10-CM | POA: Diagnosis not present

## 2014-08-31 DIAGNOSIS — D0461 Carcinoma in situ of skin of right upper limb, including shoulder: Secondary | ICD-10-CM | POA: Diagnosis not present

## 2014-08-31 DIAGNOSIS — D485 Neoplasm of uncertain behavior of skin: Secondary | ICD-10-CM | POA: Diagnosis not present

## 2014-08-31 DIAGNOSIS — L821 Other seborrheic keratosis: Secondary | ICD-10-CM | POA: Diagnosis not present

## 2014-08-31 DIAGNOSIS — L570 Actinic keratosis: Secondary | ICD-10-CM | POA: Diagnosis not present

## 2014-08-31 DIAGNOSIS — Z85828 Personal history of other malignant neoplasm of skin: Secondary | ICD-10-CM | POA: Diagnosis not present

## 2014-09-08 DIAGNOSIS — Z85828 Personal history of other malignant neoplasm of skin: Secondary | ICD-10-CM | POA: Diagnosis not present

## 2014-09-08 DIAGNOSIS — D0461 Carcinoma in situ of skin of right upper limb, including shoulder: Secondary | ICD-10-CM | POA: Diagnosis not present

## 2014-09-29 DIAGNOSIS — F329 Major depressive disorder, single episode, unspecified: Secondary | ICD-10-CM | POA: Diagnosis not present

## 2014-09-29 DIAGNOSIS — M810 Age-related osteoporosis without current pathological fracture: Secondary | ICD-10-CM | POA: Diagnosis not present

## 2014-09-29 DIAGNOSIS — Z23 Encounter for immunization: Secondary | ICD-10-CM | POA: Diagnosis not present

## 2014-09-29 DIAGNOSIS — Z0000 Encounter for general adult medical examination without abnormal findings: Secondary | ICD-10-CM | POA: Diagnosis not present

## 2014-10-20 DIAGNOSIS — Z1211 Encounter for screening for malignant neoplasm of colon: Secondary | ICD-10-CM | POA: Diagnosis not present

## 2015-03-08 DIAGNOSIS — S12100D Unspecified displaced fracture of second cervical vertebra, subsequent encounter for fracture with routine healing: Secondary | ICD-10-CM | POA: Diagnosis not present

## 2015-04-06 DIAGNOSIS — H26493 Other secondary cataract, bilateral: Secondary | ICD-10-CM | POA: Diagnosis not present

## 2015-04-07 DIAGNOSIS — N3000 Acute cystitis without hematuria: Secondary | ICD-10-CM | POA: Diagnosis not present

## 2015-04-07 DIAGNOSIS — R350 Frequency of micturition: Secondary | ICD-10-CM | POA: Diagnosis not present

## 2015-09-06 DIAGNOSIS — R030 Elevated blood-pressure reading, without diagnosis of hypertension: Secondary | ICD-10-CM | POA: Diagnosis not present

## 2015-09-06 DIAGNOSIS — S12100D Unspecified displaced fracture of second cervical vertebra, subsequent encounter for fracture with routine healing: Secondary | ICD-10-CM | POA: Diagnosis not present

## 2015-09-06 DIAGNOSIS — S12112A Nondisplaced Type II dens fracture, initial encounter for closed fracture: Secondary | ICD-10-CM | POA: Diagnosis not present

## 2015-09-30 DIAGNOSIS — Z23 Encounter for immunization: Secondary | ICD-10-CM | POA: Diagnosis not present

## 2015-09-30 DIAGNOSIS — E785 Hyperlipidemia, unspecified: Secondary | ICD-10-CM | POA: Diagnosis not present

## 2015-09-30 DIAGNOSIS — Z136 Encounter for screening for cardiovascular disorders: Secondary | ICD-10-CM | POA: Diagnosis not present

## 2015-09-30 DIAGNOSIS — Z0000 Encounter for general adult medical examination without abnormal findings: Secondary | ICD-10-CM | POA: Diagnosis not present

## 2015-09-30 DIAGNOSIS — F329 Major depressive disorder, single episode, unspecified: Secondary | ICD-10-CM | POA: Diagnosis not present

## 2015-09-30 DIAGNOSIS — Z131 Encounter for screening for diabetes mellitus: Secondary | ICD-10-CM | POA: Diagnosis not present

## 2015-09-30 DIAGNOSIS — M810 Age-related osteoporosis without current pathological fracture: Secondary | ICD-10-CM | POA: Diagnosis not present

## 2015-11-24 DIAGNOSIS — Z124 Encounter for screening for malignant neoplasm of cervix: Secondary | ICD-10-CM | POA: Diagnosis not present

## 2016-02-07 ENCOUNTER — Emergency Department (HOSPITAL_BASED_OUTPATIENT_CLINIC_OR_DEPARTMENT_OTHER)
Admission: EM | Admit: 2016-02-07 | Discharge: 2016-02-07 | Disposition: A | Discharge status: Home / Self Care | Payer: Medicare Other | Attending: Emergency Medicine | Admitting: Emergency Medicine

## 2016-02-07 ENCOUNTER — Emergency Department (HOSPITAL_BASED_OUTPATIENT_CLINIC_OR_DEPARTMENT_OTHER): Payer: Medicare Other

## 2016-02-07 ENCOUNTER — Encounter (HOSPITAL_BASED_OUTPATIENT_CLINIC_OR_DEPARTMENT_OTHER): Payer: Self-pay | Admitting: Emergency Medicine

## 2016-02-07 DIAGNOSIS — I5032 Chronic diastolic (congestive) heart failure: Secondary | ICD-10-CM | POA: Diagnosis not present

## 2016-02-07 DIAGNOSIS — Z7982 Long term (current) use of aspirin: Secondary | ICD-10-CM | POA: Insufficient documentation

## 2016-02-07 DIAGNOSIS — W19XXXA Unspecified fall, initial encounter: Secondary | ICD-10-CM

## 2016-02-07 DIAGNOSIS — Y999 Unspecified external cause status: Secondary | ICD-10-CM | POA: Insufficient documentation

## 2016-02-07 DIAGNOSIS — I110 Hypertensive heart disease with heart failure: Secondary | ICD-10-CM | POA: Diagnosis not present

## 2016-02-07 DIAGNOSIS — Y92480 Sidewalk as the place of occurrence of the external cause: Secondary | ICD-10-CM | POA: Insufficient documentation

## 2016-02-07 DIAGNOSIS — Z79899 Other long term (current) drug therapy: Secondary | ICD-10-CM | POA: Diagnosis not present

## 2016-02-07 DIAGNOSIS — S80211A Abrasion, right knee, initial encounter: Secondary | ICD-10-CM | POA: Insufficient documentation

## 2016-02-07 DIAGNOSIS — Y939 Activity, unspecified: Secondary | ICD-10-CM | POA: Insufficient documentation

## 2016-02-07 DIAGNOSIS — S0101XA Laceration without foreign body of scalp, initial encounter: Secondary | ICD-10-CM | POA: Diagnosis not present

## 2016-02-07 DIAGNOSIS — Z85828 Personal history of other malignant neoplasm of skin: Secondary | ICD-10-CM | POA: Diagnosis not present

## 2016-02-07 DIAGNOSIS — S199XXA Unspecified injury of neck, initial encounter: Secondary | ICD-10-CM | POA: Diagnosis not present

## 2016-02-07 DIAGNOSIS — S0990XA Unspecified injury of head, initial encounter: Secondary | ICD-10-CM | POA: Diagnosis not present

## 2016-02-07 DIAGNOSIS — W01198A Fall on same level from slipping, tripping and stumbling with subsequent striking against other object, initial encounter: Secondary | ICD-10-CM | POA: Insufficient documentation

## 2016-02-07 HISTORY — DX: Unspecified displaced fracture of second cervical vertebra, initial encounter for closed fracture: S12.100A

## 2016-02-07 MED ORDER — LIDOCAINE-EPINEPHRINE (PF) 2 %-1:200000 IJ SOLN
INTRAMUSCULAR | Status: AC
  Filled 2016-02-07: qty 20

## 2016-02-07 MED ORDER — LIDOCAINE-EPINEPHRINE (PF) 2 %-1:200000 IJ SOLN: 10.0000 mL | Freq: Once | INTRAMUSCULAR | Status: DC

## 2016-02-07 MED ORDER — LIDOCAINE-EPINEPHRINE 2 %-1:100000 IJ SOLN: 10.0000 mL | Freq: Once | INTRAMUSCULAR | Status: DC

## 2016-02-07 NOTE — ED Triage Notes (Signed)
Patient states that she fell and hit her head on the pavement. Have a history of "broken" neck in 2016. Patient denies any LOC, ambulatory without dizziness or complaint the patient reports that she is getting a worsening heachache though.

## 2016-02-07 NOTE — ED Notes (Signed)
c-collar applied  

## 2016-02-07 NOTE — ED Provider Notes (Signed)
[facility] Provider Note   CSN: [id number] Arrival date & time: 02/07/16  1356     History   Chief Complaint Chief Complaint  Patient presents with  . Head Injury    HPI Cheyenne Andrews is a 83 y.o. female.  HPI   Tripped over pavers, fell hit head. No LOC.  Not on blood thinners.  Headache feeling better.  Fell onto right knee but able to ambulate, mild abrasion. Pt otherwise without any injuries. Denies neck pain but concerned regarding hx of fx in past.   Past Medical History:  Diagnosis Date  . C2 cervical fracture [facility])     Patient Active Problem List   Diagnosis Date Noted  . Fall   . Faintness   . Essential hypertension   . Chronic diastolic heart failure ([place])   . Syncope 03/24/2014  . C2 cervical fracture ([place]) 03/24/2014    Past Surgical History:  Procedure Laterality Date  . ABDOMINAL HYSTERECTOMY    . CATARACT EXTRACTION    . SKIN CANCER EXCISION N/A early 2000s   removed from face     OB History    No data available       Home Medications    Prior to Admission medications   Medication Sig Start Date End Date Taking? Authorizing Provider  acetaminophen (TYLENOL) 500 MG tablet Take 1 tablet (500 mg total) by mouth every 6 (six) hours as needed for moderate pain (or Fever >/= 101). 03/26/14   Barton Dubois, MD  aspirin EC 81 MG tablet Take 1 tablet (81 mg total) by mouth daily. 03/26/14   Barton Dubois, MD  Cholecalciferol (VITAMIN D-3) 1000 UNITS CAPS Take 1 capsule by mouth 2 (two) times daily.    Historical Provider, MD  FLUoxetine (PROZAC) 10 MG capsule Take 10 mg by mouth daily.    Historical Provider, MD  Hypromellose (GENTEAL MILD OP) Apply 1-2 drops to eye 3 (three) times daily as needed (for eye dryness).    Historical Provider, MD  ibandronate (BONIVA) 150 MG tablet Take 150 mg by mouth every 30 (thirty) days. Take in the morning with a full glass of water, on an empty stomach, and do not take anything else by mouth or lie  down for the next 30 min.    Historical Provider, MD  Multiple Vitamin (MULTIVITAMIN) tablet Take 1 tablet by mouth daily.    Historical Provider, MD    Family History History reviewed. No pertinent family history.  Social History Social History  Substance Use Topics  . Smoking status: Never Smoker  . Smokeless tobacco: Never Used  . Alcohol use No     Allergies   Patient has no known allergies.   Review of Systems Review of Systems  Constitutional: Negative for fever.  HENT: Negative for sore throat.   Eyes: Negative for visual disturbance.  Respiratory: Negative for cough and shortness of breath.   Cardiovascular: Negative for chest pain.  Gastrointestinal: Negative for abdominal pain, nausea and vomiting.  Genitourinary: Negative for difficulty urinating.  Musculoskeletal: Negative for back pain and neck pain.  Skin: Positive for wound. Negative for rash.  Neurological: Positive for headaches. Negative for syncope.     Physical Exam Updated Vital Signs BP 165/80 (BP Location: Right Arm)   Pulse 64   Temp 97.8 F (36.6 C) (Oral)   Resp 16   Ht 5\' 2"  (1.575 m)   Wt 115 lb (52.2 kg)   SpO2 99%   BMI 21.03 kg/m  Physical Exam  Constitutional: She is oriented to person, place, and time. She appears well-developed and well-nourished. No distress.  HENT:  Head: Normocephalic.  4cm laceration Contusion right side of nose   Eyes: Conjunctivae and EOM are normal. Pupils are equal, round, and reactive to light.  Neck:  No midline neck tenderness  Cardiovascular: Normal rate, regular rhythm, normal heart sounds and intact distal pulses.  Exam reveals no gallop and no friction rub.   No murmur heard. Pulmonary/Chest: Effort normal and breath sounds normal. No respiratory distress. She has no wheezes. She has no rales.  Abdominal: Soft. She exhibits no distension. There is no tenderness. There is no guarding.  Musculoskeletal: She exhibits no edema or tenderness.         Right hip: She exhibits normal range of motion.       Left hip: She exhibits normal range of motion.       Right knee: She exhibits no swelling and no deformity. Lacerations: abrasion.  Neurological: She is alert and oriented to person, place, and time. She has normal strength. No cranial nerve deficit or sensory deficit. GCS eye subscore is 4. GCS verbal subscore is 5. GCS motor subscore is 6.  Skin: Skin is warm and dry. Laceration (4cm forehead deep laceration, galea intact) noted. No rash noted. She is not diaphoretic. No erythema.  Nursing note and vitals reviewed.    ED Treatments / Results  Labs (all labs ordered are listed, but only abnormal results are displayed) Labs Reviewed - No data to display  EKG  EKG Interpretation None       Radiology Ct Head Wo Contrast  Result Date: 02/07/2016 CLINICAL DATA:  Patient fell and hit head on pavement. History of type 2 odontoid fracture. EXAM: CT HEAD WITHOUT CONTRAST CT CERVICAL SPINE WITHOUT CONTRAST TECHNIQUE: Multidetector CT imaging of the head and cervical spine was performed following the standard protocol without intravenous contrast. Multiplanar CT image reconstructions of the cervical spine were also generated. COMPARISON:  Cervical spine CT 06/01/2014.  Head CT 03/24/2014. FINDINGS: CT HEAD FINDINGS Brain: There is no evidence for acute hemorrhage, hydrocephalus, mass lesion, or abnormal extra-axial fluid collection. No definite CT evidence for acute infarction. Diffuse loss of parenchymal volume is consistent with atrophy. Patchy low attenuation in the deep hemispheric and periventricular white matter is nonspecific, but likely reflects chronic microvascular ischemic demyelination. Vascular: Atherosclerotic calcification is visualized in the carotid arteries. No dense MCA sign. Major dural sinuses are unremarkable. Skull: No evidence for fracture. No worrisome lytic or sclerotic lesion. Sinuses/Orbits: The visualized  paranasal sinuses and mastoid air cells are clear. Visualized portions of the globes and intraorbital fat are unremarkable. Other: None. CT CERVICAL SPINE FINDINGS Alignment: Type 2 dens fracture seen on the previous study has healed in the interval. No evidence for subluxation. The facets are well aligned bilaterally. Mild straightening of normal cervical lordosis. Skull base and vertebrae: No evidence for an acute fracture. Left-sided facet osteoarthritis is seen at mid cervical levels. Soft tissues and spinal canal: Choose 1 Disc levels:  Mild loss of disc height is seen at C6-7. Upper chest: Biapical pleural-parenchymal scarring. Other: None. IMPRESSION: 1. No acute intracranial abnormality. 2. Atrophy with chronic small vessel white matter disease. 3. Type II odontoid fracture seen previously has healed in the interval. No acute cervical spine fracture identified on the current study. Electronically Signed   By: Misty Stanley M.D.   On: 02/07/2016 14:52   Ct Cervical Spine Wo Contrast  Result Date: 02/07/2016 CLINICAL DATA:  Patient fell and hit head on pavement. History of type 2 odontoid fracture. EXAM: CT HEAD WITHOUT CONTRAST CT CERVICAL SPINE WITHOUT CONTRAST TECHNIQUE: Multidetector CT imaging of the head and cervical spine was performed following the standard protocol without intravenous contrast. Multiplanar CT image reconstructions of the cervical spine were also generated. COMPARISON:  Cervical spine CT 06/01/2014.  Head CT 03/24/2014. FINDINGS: CT HEAD FINDINGS Brain: There is no evidence for acute hemorrhage, hydrocephalus, mass lesion, or abnormal extra-axial fluid collection. No definite CT evidence for acute infarction. Diffuse loss of parenchymal volume is consistent with atrophy. Patchy low attenuation in the deep hemispheric and periventricular white matter is nonspecific, but likely reflects chronic microvascular ischemic demyelination. Vascular: Atherosclerotic calcification is  visualized in the carotid arteries. No dense MCA sign. Major dural sinuses are unremarkable. Skull: No evidence for fracture. No worrisome lytic or sclerotic lesion. Sinuses/Orbits: The visualized paranasal sinuses and mastoid air cells are clear. Visualized portions of the globes and intraorbital fat are unremarkable. Other: None. CT CERVICAL SPINE FINDINGS Alignment: Type 2 dens fracture seen on the previous study has healed in the interval. No evidence for subluxation. The facets are well aligned bilaterally. Mild straightening of normal cervical lordosis. Skull base and vertebrae: No evidence for an acute fracture. Left-sided facet osteoarthritis is seen at mid cervical levels. Soft tissues and spinal canal: Choose 1 Disc levels:  Mild loss of disc height is seen at C6-7. Upper chest: Biapical pleural-parenchymal scarring. Other: None. IMPRESSION: 1. No acute intracranial abnormality. 2. Atrophy with chronic small vessel white matter disease. 3. Type II odontoid fracture seen previously has healed in the interval. No acute cervical spine fracture identified on the current study. Electronically Signed   By: Misty Stanley M.D.   On: 02/07/2016 14:52    Procedures .Marland KitchenLaceration Repair Date/Time: 02/07/2016 10:17 PM Performed by: Gareth Morgan Authorized by: Gareth Morgan   Consent:    Consent obtained:  Verbal   Risks discussed:  Infection, pain and poor cosmetic result Anesthesia (see MAR for exact dosages):    Anesthesia method:  Local infiltration   Local anesthetic:  Lidocaine 1% WITH epi Laceration details:    Location:  Scalp (forehead)   Scalp location:  Frontal   Length (cm):  4   Depth (mm):  4 Repair type:    Repair type:  Intermediate Pre-procedure details:    Preparation:  Patient was prepped and draped in usual sterile fashion and imaging obtained to evaluate for foreign bodies Exploration:    Contaminated: no   Treatment:    Area cleansed with:  Saline   Amount of  cleaning:  Standard   Irrigation solution:  Sterile saline   Irrigation volume:  500 Subcutaneous repair:    Suture size:  4-0   Suture material:  Vicryl   Suture technique:  Simple interrupted   Number of sutures:  3 Skin repair:    Repair method:  Sutures   Suture size:  6-0   Suture material:  Fast-absorbing gut   Number of sutures:  6 Post-procedure details:    Patient tolerance of procedure:  Tolerated well, no immediate complications   (including critical care time)  Medications Ordered in ED Medications - No data to display   Initial Impression / Assessment and Plan / ED Course  I have reviewed the triage vital signs and the nursing notes.  Pertinent labs & imaging results that were available during my care of the patient were reviewed by  me and considered in my medical decision making (see chart for details).    83yo female with hx of htn, diastolic heart failure, C2 cervical fx presents with concern for mechanical fall from standing with head trauma and laceration. CT head and cervical spine WNL. Laceration irrigated and repaired as above. Patient discharged in stable condition with understanding of reasons to return.   Final Clinical Impressions(s) / ED Diagnoses   Final diagnoses:  Laceration of scalp without foreign body, initial encounter  Fall, initial encounter    New Prescriptions Discharge Medication List as of 02/07/2016  5:07 PM       Gareth Morgan, MD 02/07/16 2219

## 2016-02-07 NOTE — ED Triage Notes (Signed)
Nurse first-pt had bandaid to forehead with no bleed through-denies LOC or taking blood thinners

## 2016-03-09 DIAGNOSIS — L821 Other seborrheic keratosis: Secondary | ICD-10-CM | POA: Diagnosis not present

## 2016-03-09 DIAGNOSIS — Z85828 Personal history of other malignant neoplasm of skin: Secondary | ICD-10-CM | POA: Diagnosis not present

## 2016-03-09 DIAGNOSIS — L905 Scar conditions and fibrosis of skin: Secondary | ICD-10-CM | POA: Diagnosis not present

## 2016-03-09 DIAGNOSIS — D225 Melanocytic nevi of trunk: Secondary | ICD-10-CM | POA: Diagnosis not present

## 2016-03-09 DIAGNOSIS — L298 Other pruritus: Secondary | ICD-10-CM | POA: Diagnosis not present

## 2016-03-09 DIAGNOSIS — L570 Actinic keratosis: Secondary | ICD-10-CM | POA: Diagnosis not present

## 2016-06-18 DIAGNOSIS — H26491 Other secondary cataract, right eye: Secondary | ICD-10-CM | POA: Diagnosis not present

## 2016-06-26 IMAGING — CT CT CERVICAL SPINE W/O CM
4 of 5 series · 15 of 33 positions shown, 17 images · non-contrast
Comparison: CT head 12/02/2012

CLINICAL DATA: Fall face first on the concrete. Abrasions to right
frontal region. Severe posterior neck pain.

EXAM:
CT HEAD WITHOUT CONTRAST
CT CERVICAL SPINE WITHOUT CONTRAST
TECHNIQUE: Multidetector CT imaging of the head and cervical spine was
performed following the standard protocol without intravenous
contrast. Multiplanar CT image reconstructions of the cervical spine
were also generated.

[Series 5: c_spine 2.0 b41s st · axial · 0.28mm/px · z∈[-280,-176]mm · 4 of 88 slices shown, 5 images]
[im 18/88  soft-tissue]
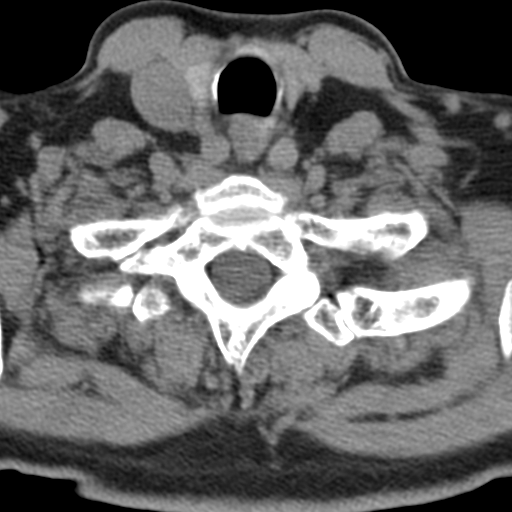
[im 18/88  bone]
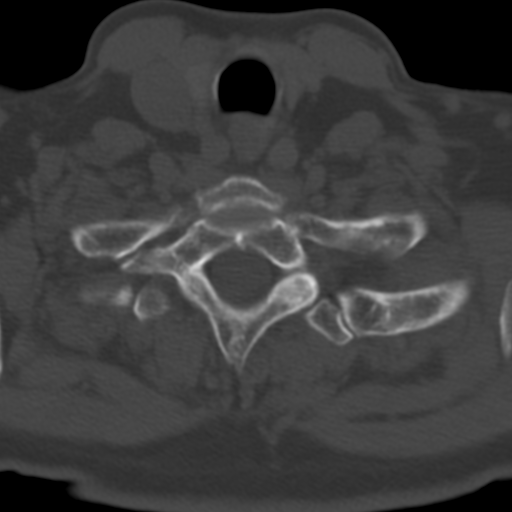
[im 35/88  bone]
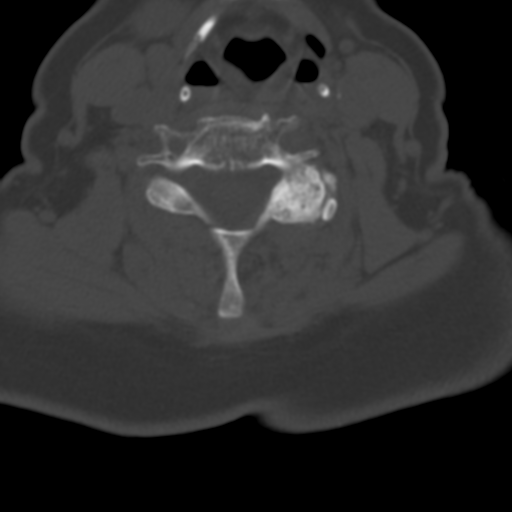
[im 53/88  bone]
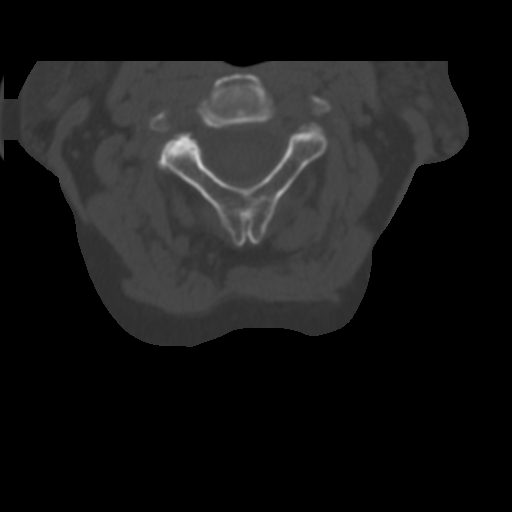
[im 70/88  bone]
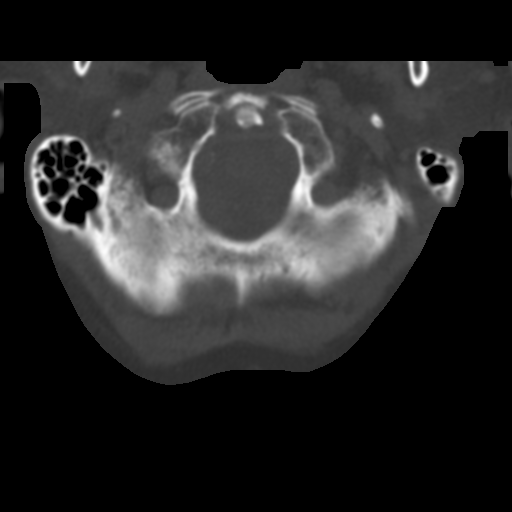

[Series 8: c_spine 2.0 coronal · coronal · 0.32mm/px · 3 of 65 slices shown]
[im 13/65  bone]
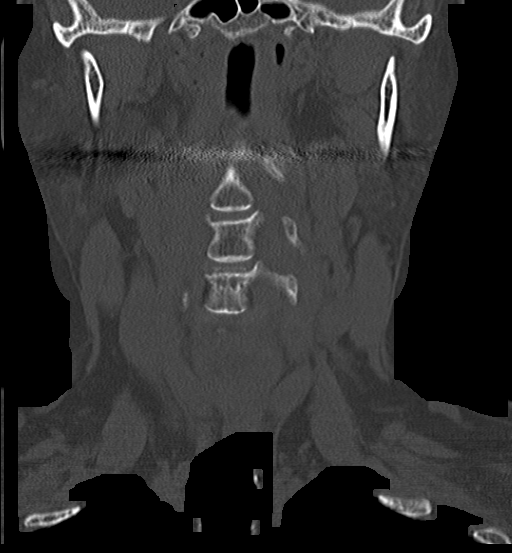
[im 26/65  bone]
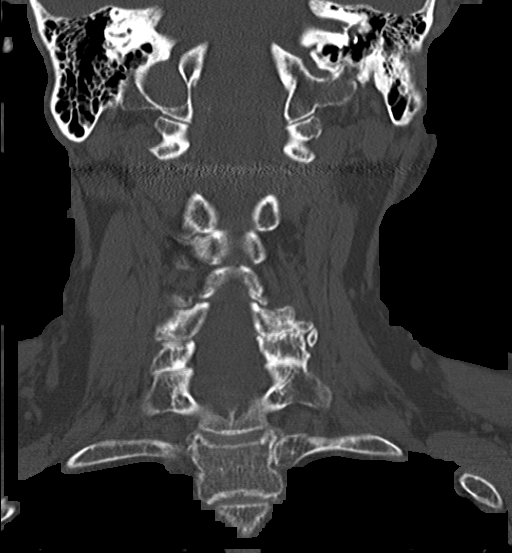
[im 39/65  bone]
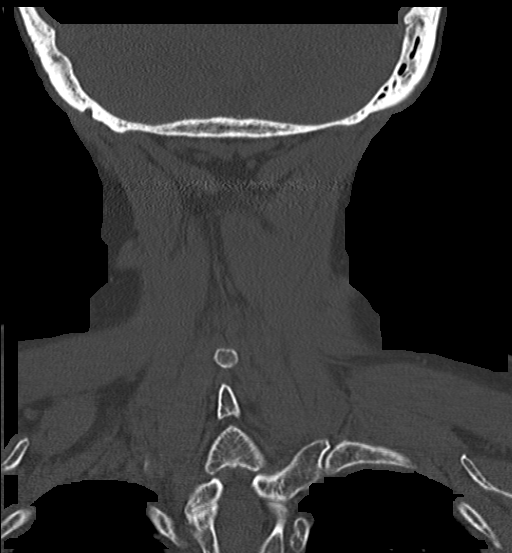

[Series 9: c_spine 2.0 sagittal · sagittal · 0.28mm/px · 5 of 64 slices shown, 6 images]
[im 22/64  bone]
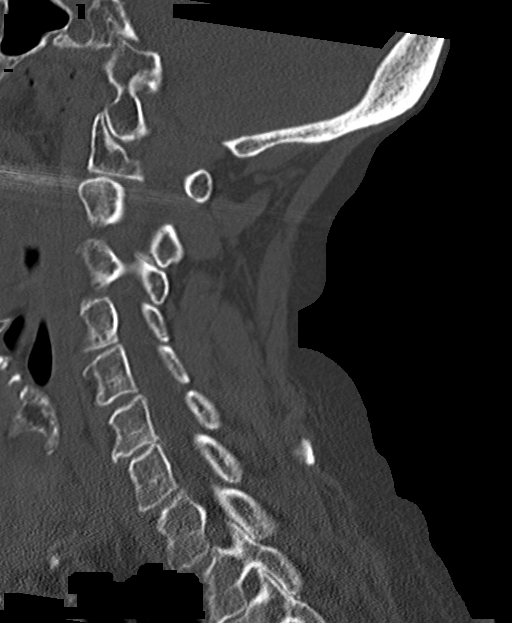
[im 27/64  bone]
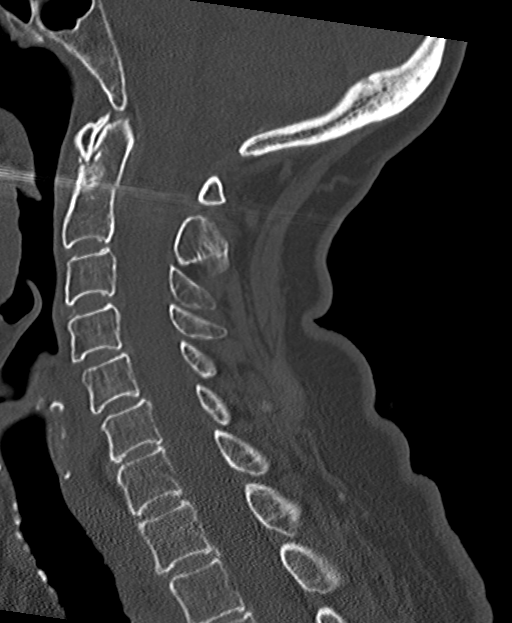
[im 32/64  soft-tissue]
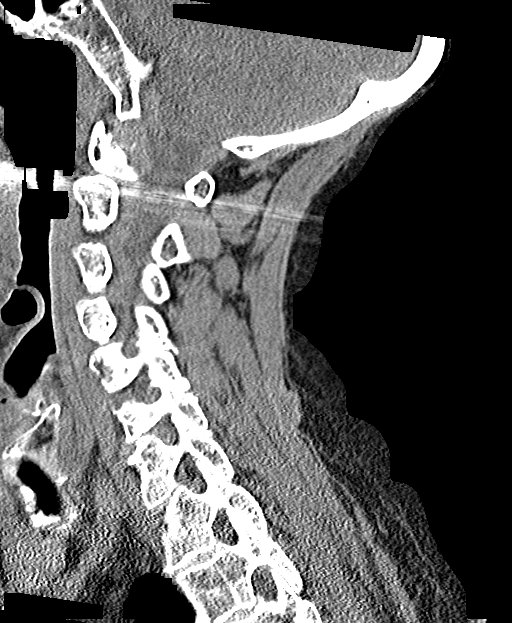
[im 32/64  bone]
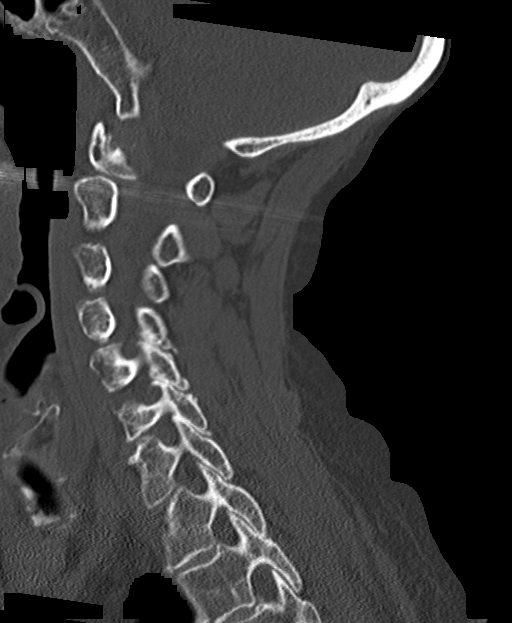
[im 37/64  bone]
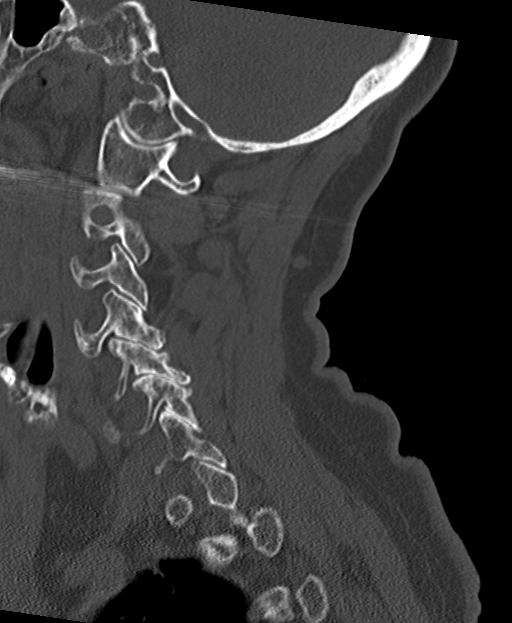
[im 43/64  bone]
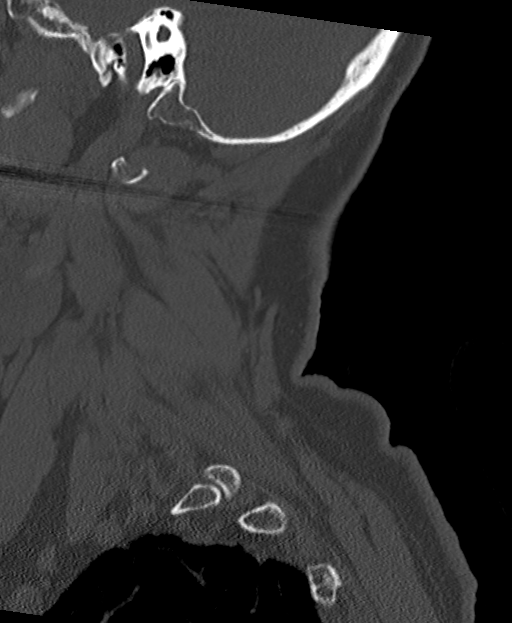

[Series 10: c_spine 2.0 orth ax · axial · 0.28mm/px · z∈[-288,-221]mm · 3 of 87 slices shown]
[im 18/87  bone]
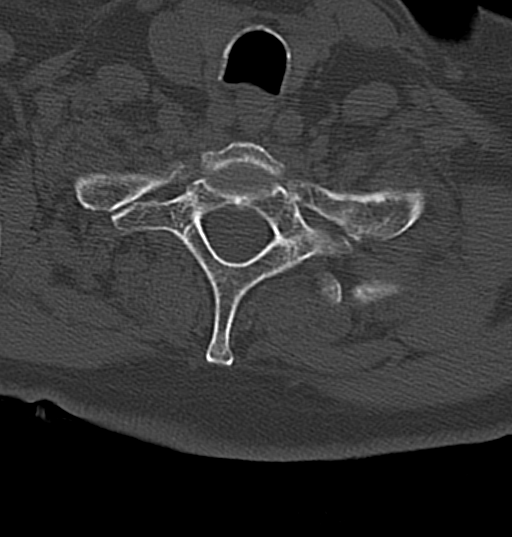
[im 35/87  bone]
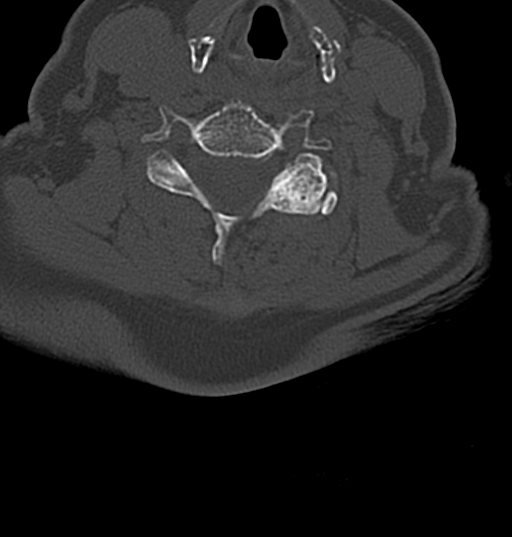
[im 52/87  bone]
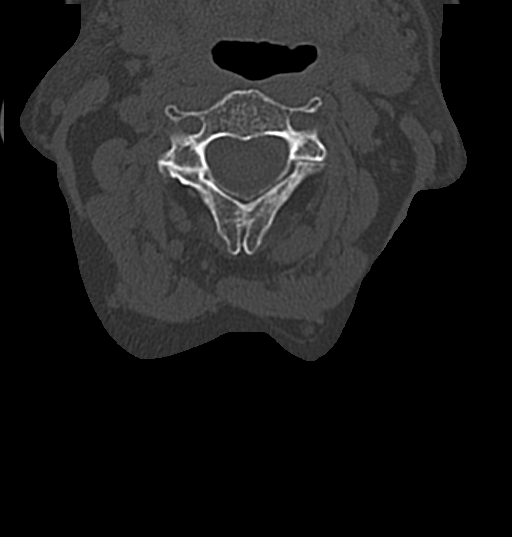

[15 of 33 positions shown; findings below may reference images not displayed]

FINDINGS: CT HEAD FINDINGS

Mild chronic microvascular changes throughout the deep white matter.
No acute intracranial abnormality. Specifically, no hemorrhage,
hydrocephalus, mass lesion, acute infarction, or significant
intracranial injury. No acute calvarial abnormality. Soft tissue
swelling over the right forehead.

Visualized paranasal sinuses and mastoids clear. Orbital soft
tissues unremarkable.

CT CERVICAL SPINE FINDINGS

There is a fracture through the base of the odontoid, nondisplaced.
No overlying soft tissue swelling. No additional fracture. No
epidural or paraspinal hematoma. Normal alignment.
IMPRESSION: Chronic microvascular disease.  No acute intracranial abnormality.

Nondisplaced type 2 odontoid fracture.

Critical Value/emergent results were called by telephone at the time
of interpretation on 03/24/2014 at [DATE] to Dr. JUVONEN TEIKSALA , who
verbally acknowledged these results.

## 2016-07-16 DIAGNOSIS — H26491 Other secondary cataract, right eye: Secondary | ICD-10-CM | POA: Diagnosis not present

## 2016-10-02 DIAGNOSIS — M810 Age-related osteoporosis without current pathological fracture: Secondary | ICD-10-CM | POA: Diagnosis not present

## 2016-10-02 DIAGNOSIS — F329 Major depressive disorder, single episode, unspecified: Secondary | ICD-10-CM | POA: Diagnosis not present

## 2016-10-02 DIAGNOSIS — Z1389 Encounter for screening for other disorder: Secondary | ICD-10-CM | POA: Diagnosis not present

## 2016-10-02 DIAGNOSIS — Z1211 Encounter for screening for malignant neoplasm of colon: Secondary | ICD-10-CM | POA: Diagnosis not present

## 2016-10-02 DIAGNOSIS — Z131 Encounter for screening for diabetes mellitus: Secondary | ICD-10-CM | POA: Diagnosis not present

## 2016-10-02 DIAGNOSIS — Z0000 Encounter for general adult medical examination without abnormal findings: Secondary | ICD-10-CM | POA: Diagnosis not present

## 2016-10-02 DIAGNOSIS — Z23 Encounter for immunization: Secondary | ICD-10-CM | POA: Diagnosis not present

## 2016-10-17 DIAGNOSIS — Z1211 Encounter for screening for malignant neoplasm of colon: Secondary | ICD-10-CM | POA: Diagnosis not present

## 2017-01-22 DIAGNOSIS — H26492 Other secondary cataract, left eye: Secondary | ICD-10-CM | POA: Diagnosis not present

## 2017-03-08 DIAGNOSIS — D2239 Melanocytic nevi of other parts of face: Secondary | ICD-10-CM | POA: Diagnosis not present

## 2017-03-08 DIAGNOSIS — Z85828 Personal history of other malignant neoplasm of skin: Secondary | ICD-10-CM | POA: Diagnosis not present

## 2017-03-08 DIAGNOSIS — L814 Other melanin hyperpigmentation: Secondary | ICD-10-CM | POA: Diagnosis not present

## 2017-03-08 DIAGNOSIS — D225 Melanocytic nevi of trunk: Secondary | ICD-10-CM | POA: Diagnosis not present

## 2017-03-08 DIAGNOSIS — D1801 Hemangioma of skin and subcutaneous tissue: Secondary | ICD-10-CM | POA: Diagnosis not present

## 2017-03-08 DIAGNOSIS — L820 Inflamed seborrheic keratosis: Secondary | ICD-10-CM | POA: Diagnosis not present

## 2017-03-08 DIAGNOSIS — L570 Actinic keratosis: Secondary | ICD-10-CM | POA: Diagnosis not present

## 2017-03-08 DIAGNOSIS — L720 Epidermal cyst: Secondary | ICD-10-CM | POA: Diagnosis not present

## 2017-03-08 DIAGNOSIS — L821 Other seborrheic keratosis: Secondary | ICD-10-CM | POA: Diagnosis not present

## 2017-07-30 DIAGNOSIS — H26492 Other secondary cataract, left eye: Secondary | ICD-10-CM | POA: Diagnosis not present

## 2017-10-15 DIAGNOSIS — R413 Other amnesia: Secondary | ICD-10-CM | POA: Diagnosis not present

## 2017-10-15 DIAGNOSIS — Z1211 Encounter for screening for malignant neoplasm of colon: Secondary | ICD-10-CM | POA: Diagnosis not present

## 2017-10-15 DIAGNOSIS — Z23 Encounter for immunization: Secondary | ICD-10-CM | POA: Diagnosis not present

## 2017-10-15 DIAGNOSIS — Z0000 Encounter for general adult medical examination without abnormal findings: Secondary | ICD-10-CM | POA: Diagnosis not present

## 2017-10-15 DIAGNOSIS — M810 Age-related osteoporosis without current pathological fracture: Secondary | ICD-10-CM | POA: Diagnosis not present

## 2017-10-15 DIAGNOSIS — L659 Nonscarring hair loss, unspecified: Secondary | ICD-10-CM | POA: Diagnosis not present

## 2017-10-15 DIAGNOSIS — F3341 Major depressive disorder, recurrent, in partial remission: Secondary | ICD-10-CM | POA: Diagnosis not present

## 2017-10-15 DIAGNOSIS — Z85828 Personal history of other malignant neoplasm of skin: Secondary | ICD-10-CM | POA: Diagnosis not present

## 2017-11-22 DIAGNOSIS — F3341 Major depressive disorder, recurrent, in partial remission: Secondary | ICD-10-CM | POA: Diagnosis not present

## 2017-11-22 DIAGNOSIS — Z6821 Body mass index (BMI) 21.0-21.9, adult: Secondary | ICD-10-CM | POA: Diagnosis not present

## 2018-01-28 DIAGNOSIS — H26492 Other secondary cataract, left eye: Secondary | ICD-10-CM | POA: Diagnosis not present

## 2018-01-31 DIAGNOSIS — Z6821 Body mass index (BMI) 21.0-21.9, adult: Secondary | ICD-10-CM | POA: Diagnosis not present

## 2018-01-31 DIAGNOSIS — M7552 Bursitis of left shoulder: Secondary | ICD-10-CM | POA: Diagnosis not present

## 2018-02-17 ENCOUNTER — Ambulatory Visit: Payer: Medicare Other | Attending: Family Medicine | Admitting: Physical Therapy

## 2018-02-17 ENCOUNTER — Other Ambulatory Visit: Payer: Self-pay

## 2018-02-17 ENCOUNTER — Encounter: Payer: Self-pay | Admitting: Physical Therapy

## 2018-02-17 DIAGNOSIS — M25612 Stiffness of left shoulder, not elsewhere classified: Secondary | ICD-10-CM | POA: Insufficient documentation

## 2018-02-17 DIAGNOSIS — M25512 Pain in left shoulder: Secondary | ICD-10-CM | POA: Diagnosis not present

## 2018-02-17 DIAGNOSIS — R2681 Unsteadiness on feet: Secondary | ICD-10-CM | POA: Insufficient documentation

## 2018-02-17 NOTE — Therapy (Signed)
[place] [facility] [address] [place], [place], [address] Phone: [phone]   Fax:  [phone]  Physical Therapy Evaluation  Patient Details  Name: Cheyenne Andrews MRN: [id number] Date of Birth: [birth year] Referring Provider (PT): Kathyrn Lass   Encounter Date: 02/17/2018  PT End of Session - 02/17/18 1602    Visit Number  1    Date for PT Re-Evaluation  04/18/18    PT Start Time  4825    PT Stop Time  1530    PT Time Calculation (min)  45 min    Activity Tolerance  Patient tolerated treatment well    Behavior During Therapy  [facility] for tasks assessed/performed       Past Medical History:  Diagnosis Date  . C2 cervical fracture [facility])     Past Surgical History:  Procedure Laterality Date  . ABDOMINAL HYSTERECTOMY    . CATARACT EXTRACTION    . SKIN CANCER EXCISION N/A early 2000s   removed from face     There were no vitals filed for this visit.   Subjective Assessment - 02/17/18 1445    Subjective  Patient reports left shoulder pain and stiffness, MD dx was left frozen shoulder and left shoulder bursitis.  She reports that had 2 shingles shots and a Flu shot without much issue the past fall, she reports that she had her BP taken in November and she reports she started having left shoulder pain at that time.  She reports that since that time she has had left shoulder pain and has lost ROM    Patient is accompained by:  Family member    Pertinent History  past C2 fracture 2014 with some neck pain    Limitations  Lifting;House hold activities    Patient Stated Goals  have less pain and better ROM,. better function    Currently in Pain?  Yes    Pain Score  2     Pain Location  Shoulder    Pain Orientation  Left    Pain Descriptors / Indicators  Aching;Sore;Tightness    Pain Type  Acute pain    Pain Radiating Towards  denies    Pain Onset  More than a month ago    Pain Frequency  Constant    Aggravating Factors    any activity, reaching, can't get hand behind her pain up to 7/10    Pain Relieving Factors  rest, not moving pain a 1-2/10    Effect of Pain on Daily Activities  reports pain, difficulty with ADL's. dressing and doing hair         [facility] PT Assessment - 02/17/18 0001      Assessment   Medical Diagnosis  Left shoulder pain and decreased ROM    Referring Provider (PT)  Kathyrn Lass    Onset Date/Surgical Date  01/17/18    Prior Therapy  no      Precautions   Precautions  Fall      Balance Screen   Has the patient fallen in the past 6 months  Yes    How many times?  1    Has the patient had a decrease in activity level because of a fear of falling?   Yes    Is the patient reluctant to leave their home because of a fear of falling?   No      Home Environment   Additional Comments  lives alone, does the housework  Prior Function   Level of Independence  Independent    Vocation  Retired    Leisure  no exercise      Cognition   Memory  Impaired      Posture/Postural Control   Posture Comments  fwd head, rounded shoulders      ROM / Strength   AROM / PROM / Strength  AROM;PROM;Strength      AROM   Overall AROM Comments  all active motions increase pain    AROM Assessment Site  Shoulder    Right/Left Shoulder  Left    Left Shoulder Flexion  108 Degrees    Left Shoulder ABduction  80 Degrees    Left Shoulder Internal Rotation  25 Degrees    Left Shoulder External Rotation  40 Degrees      PROM   Overall PROM Comments  all passive motions increase the pain    PROM Assessment Site  Shoulder    Right/Left Shoulder  Left    Left Shoulder Flexion  132 Degrees    Left Shoulder ABduction  105 Degrees    Left Shoulder Internal Rotation  25 Degrees    Left Shoulder External Rotation  50 Degrees      Strength   Overall Strength Comments  4-/5 with pain      Palpation   Palpation comment  she is mildly tender in the left shoulder and upper trap area, does have crepitus in  the left shoulder mostly with eccentric ROM      Special Tests   Other special tests  + impingement test      Ambulation/Gait   Gait Comments  no device, holds daughter or walls, slow shuffling gait      Standardized Balance Assessment   Standardized Balance Assessment  Berg Balance Test      Berg Balance Test   Sit to Stand  Able to stand  independently using hands    Standing Unsupported  Able to stand safely 2 minutes    Sitting with Back Unsupported but Feet Supported on Floor or Stool  Able to sit safely and securely 2 minutes    Stand to Sit  Sits safely with minimal use of hands    Transfers  Able to transfer safely, minor use of hands    Standing Unsupported with Eyes Closed  Able to stand 3 seconds    Standing Ubsupported with Feet Together  Needs help to attain position but able to stand for 30 seconds with feet together    From Standing, Reach Forward with Outstretched Arm  Can reach forward >12 cm safely (5")    From Standing Position, Pick up Object from Floor  Able to pick up shoe safely and easily    From Standing Position, Turn to Look Behind Over each Shoulder  Turn sideways only but maintains balance    Turn 360 Degrees  Able to turn 360 degrees safely one side only in 4 seconds or less    Standing Unsupported, Alternately Place Feet on Step/Stool  Able to complete 4 steps without aid or supervision    Standing Unsupported, One Foot in Front  Needs help to step but can hold 15 seconds    Standing on One Leg  Able to lift leg independently and hold equal to or more than 3 seconds    Total Score  39    Berg comment:  needs multiple instructions to perform a task  Objective measurements completed on examination: See above findings.              PT Education - 02/17/18 1601    Education Details  standing 4 way kicks holding chair or counter, and AAROM of the shoulder    Person(s) Educated  Patient;Child(ren)    Methods   Explanation;Demonstration;Handout    Comprehension  Verbalized understanding       PT Short Term Goals - 02/17/18 1608      PT SHORT TERM GOAL #1   Title  independent with initial HEP    Time  2    Period  Weeks    Status  New        PT Long Term Goals - 02/17/18 1608      PT LONG TERM GOAL #1   Title  understand fall risks int he home    Time  8    Period  Weeks    Status  New      PT LONG TERM GOAL #2   Title  increase left shoulder IR to 65 degrees    Time  8    Period  Weeks    Status  New      PT LONG TERM GOAL #3   Title  increase left shoulder flexion to 140 degrees    Time  8    Period  Weeks    Status  New      PT LONG TERM GOAL #4   Title  report no difficulty with dressing    Time  8    Period  Weeks    Status  New      PT LONG TERM GOAL #5   Title  increase Berg balance test score to 46/56    Time  8    Period  Weeks    Status  New             Plan - 02/17/18 1603    Clinical Impression Statement  Patient has a new onset of left frozen shoulder, bursitis, she seems to have issues with cognition as she asked the same questions multiple times, had difficulty following directions and tasks, her daughter was present and that seemed to be some distraction.  Her left shoulder is missing about 1/2 of normal ROM, had a + impingement test, negatvie empty can test.  Merrilee Jansky Balance was 39/56 putting her at high risk for falls.  Her shoulder ROM is limited mostly with IR.  She reports to me that she lacks motivation.Marland Kitchen  Has had a fall recently.    Clinical Presentation  Evolving    Clinical Decision Making  Moderate    Rehab Potential  Fair    Clinical Impairments Affecting Rehab Potential  cognition    PT Frequency  2x / week    PT Duration  8 weeks    PT Treatment/Interventions  ADLs/Self Care Home Management;Electrical Stimulation;Cryotherapy;Moist Heat;Ultrasound;Therapeutic activities;Functional mobility training;Gait training;Balance  training;Neuromuscular re-education;Therapeutic exercise;Patient/family education    PT Next Visit Plan  slowly progress left shoulder ROM and work on functional balance    Consulted and Agree with Plan of Care  Patient       Patient will benefit from skilled therapeutic intervention in order to improve the following deficits and impairments:  Abnormal gait, Decreased cognition, Pain, Postural dysfunction, Decreased mobility, Decreased activity tolerance, Decreased range of motion, Decreased strength, Impaired UE functional use, Decreased balance, Difficulty walking  Visit Diagnosis: Acute pain of left shoulder -  Plan: PT plan of care cert/re-cert  Stiffness of left shoulder, not elsewhere classified - Plan: PT plan of care cert/re-cert  Unsteadiness on feet - Plan: PT plan of care cert/re-cert     Problem List Patient Active Problem List   Diagnosis Date Noted  . Fall   . Faintness   . Essential hypertension   . Chronic diastolic heart failure ([place])   . Syncope 03/24/2014  . C2 cervical fracture ([place]) 03/24/2014    Sumner Boast., PT 02/17/2018, 4:11 PM  [place] [address] [place], [place], [address] Phone: [phone]   Fax:  [phone]  Name: Cheyenne Andrews MRN: [id number] Date of Birth: 1933/08/30

## 2018-02-24 ENCOUNTER — Encounter: Payer: Self-pay | Admitting: Physical Therapy

## 2018-02-24 ENCOUNTER — Ambulatory Visit: Payer: Medicare Other | Admitting: Physical Therapy

## 2018-02-24 DIAGNOSIS — M25512 Pain in left shoulder: Secondary | ICD-10-CM

## 2018-02-24 DIAGNOSIS — R2681 Unsteadiness on feet: Secondary | ICD-10-CM

## 2018-02-24 DIAGNOSIS — M25612 Stiffness of left shoulder, not elsewhere classified: Secondary | ICD-10-CM | POA: Diagnosis not present

## 2018-02-24 NOTE — Therapy (Signed)
[place] [address] [place], [place], [address] Phone: [phone]   Fax:  [phone]  Physical Therapy Treatment  Patient Details  Name: Cheyenne Andrews MRN: [id number] Date of Birth: 1933-12-21 Referring Provider (PT): Kathyrn Lass   Encounter Date: 02/24/2018  PT End of Session - 02/24/18 1425    Visit Number  2    Date for PT Re-Evaluation  04/18/18    PT Start Time  1345    PT Stop Time  1427    PT Time Calculation (min)  42 min    Activity Tolerance  Patient tolerated treatment well    Behavior During Therapy  [facility] for tasks assessed/performed       Past Medical History:  Diagnosis Date  . C2 cervical fracture [facility])     Past Surgical History:  Procedure Laterality Date  . ABDOMINAL HYSTERECTOMY    . CATARACT EXTRACTION    . SKIN CANCER EXCISION N/A early 2000s   removed from face     There were no vitals filed for this visit.  Subjective Assessment - 02/24/18 1345    Subjective  "The arm still hurts"    Pain Score  2     Pain Location  Shoulder    Pain Orientation  Left                       [facility] Adult PT Treatment/Exercise - 02/24/18 0001      High Level Balance   High Level Balance Activities  Side stepping;Backward walking;Negotitating around obstacles;Negotiating over obstacles;Marching forwards;Figure 8 turns    High Level Balance Comments  forward ans side step over small objects      Exercises   Exercises  Shoulder;Lumbar      Shoulder Exercises: Standing   Horizontal ABduction  Theraband;20 reps;Left    Theraband Level (Shoulder Horizontal ABduction)  Level 2 (Red)    External Rotation  Left;20 reps;Theraband    Theraband Level (Shoulder External Rotation)  Level 2 (Red)    Flexion  AROM;Both;20 reps    ABduction  AROM;20 reps;Both    Extension  Theraband;20 reps;Both    Theraband Level (Shoulder Extension)  Level 2 (Red)    Row  Theraband;20 reps;Both    Theraband Level (Shoulder Row)  Level 2 (Red)    Other Standing Exercises  Flex, Ext, IR AAROM cane 2x10       Shoulder Exercises: ROM/Strengthening   Nustep  L3 x 7 min               PT Short Term Goals - 02/24/18 1427      PT SHORT TERM GOAL #1   Title  independent with initial HEP    Status  Achieved        PT Long Term Goals - 02/24/18 1427      PT LONG TERM GOAL #1   Title  understand fall risks int he home    Status  On-going            Plan - 02/24/18 1428    Clinical Impression Statement  Pt tolerated an initial progression to TE well. Some cognitive issues requiring instruction to be given multiple of times. She did really well with all balance activities with no LOB just SBA. Tactile cues needed to keep arm next to her side with ER and IR.     Rehab Potential  Fair    Clinical Impairments Affecting Rehab  Potential  cognition    PT Frequency  2x / week    PT Duration  8 weeks    PT Treatment/Interventions  ADLs/Self Care Home Management;Electrical Stimulation;Cryotherapy;Moist Heat;Ultrasound;Therapeutic activities;Functional mobility training;Gait training;Balance training;Neuromuscular re-education;Therapeutic exercise;Patient/family education    PT Next Visit Plan  slowly progress left shoulder ROM and work on functional balance       Patient will benefit from skilled therapeutic intervention in order to improve the following deficits and impairments:  Abnormal gait, Decreased cognition, Pain, Postural dysfunction, Decreased mobility, Decreased activity tolerance, Decreased range of motion, Decreased strength, Impaired UE functional use, Decreased balance, Difficulty walking  Visit Diagnosis: Stiffness of left shoulder, not elsewhere classified  Acute pain of left shoulder  Unsteadiness on feet     Problem List Patient Active Problem List   Diagnosis Date Noted  . Fall   . Faintness   . Essential hypertension   . Chronic diastolic heart  failure ([place])   . Syncope 03/24/2014  . C2 cervical fracture ([place]) 03/24/2014    Scot Jun, PTA 02/24/2018, 2:37 PM  [address] [place], [place], [address] Phone: [phone]   Fax:  [phone]  Name: Blayne Garlick MRN: [id number] Date of Birth: 1933/11/22

## 2018-02-26 ENCOUNTER — Ambulatory Visit: Payer: Medicare Other | Admitting: Physical Therapy

## 2018-02-26 ENCOUNTER — Encounter: Payer: Self-pay | Admitting: Physical Therapy

## 2018-02-26 DIAGNOSIS — M25512 Pain in left shoulder: Secondary | ICD-10-CM | POA: Diagnosis not present

## 2018-02-26 DIAGNOSIS — M25612 Stiffness of left shoulder, not elsewhere classified: Secondary | ICD-10-CM

## 2018-02-26 DIAGNOSIS — R2681 Unsteadiness on feet: Secondary | ICD-10-CM | POA: Diagnosis not present

## 2018-02-26 NOTE — Therapy (Signed)
[place] [address] [place], [place], [address] Phone: [phone]   Fax:  [phone]  Physical Therapy Treatment  Patient Details  Name: Cheyenne Andrews MRN: [id number] Date of Birth: May 12, 1933 Referring Provider (PT): Kathyrn Lass   Encounter Date: 02/26/2018  PT End of Session - 02/26/18 1501    Visit Number  3    PT Start Time  1400    PT Stop Time  1440    PT Time Calculation (min)  40 min    Activity Tolerance  Patient tolerated treatment well    Behavior During Therapy  [facility] for tasks assessed/performed       Past Medical History:  Diagnosis Date  . C2 cervical fracture [facility])     Past Surgical History:  Procedure Laterality Date  . ABDOMINAL HYSTERECTOMY    . CATARACT EXTRACTION    . SKIN CANCER EXCISION N/A early 2000s   removed from face     There were no vitals filed for this visit.  Subjective Assessment - 02/26/18 1401    Subjective  "about the same"    Currently in Pain?  Yes    Pain Score  2     Pain Location  Shoulder    Pain Orientation  Left    Aggravating Factors   use of the arm    Pain Relieving Factors  rest                       [facility] Adult PT Treatment/Exercise - 02/26/18 0001      Ambulation/Gait   Gait Comments  gait outside down slope, up slope around the building, going down the slope she had some "out of control" feeling and foot scuff, but overall did well      High Level Balance   High Level Balance Activities  Side stepping;Backward walking      Shoulder Exercises: Seated   Internal Rotation  Left;20 reps;Theraband    Theraband Level (Shoulder Internal Rotation)  Level 2 (Red)    Internal Rotation Limitations  cues needed      Shoulder Exercises: Standing   Horizontal ABduction  Theraband;20 reps;Left    Theraband Level (Shoulder Horizontal ABduction)  Level 2 (Red)    External Rotation  Left;20 reps;Theraband    Theraband Level (Shoulder  External Rotation)  Level 2 (Red)    Extension  Theraband;20 reps;Both    Theraband Level (Shoulder Extension)  Level 2 (Red)    Row  Theraband;20 reps;Both    Theraband Level (Shoulder Row)  Level 2 (Red)      Shoulder Exercises: ROM/Strengthening   UBE (Upper Arm Bike)  level 2 x 6 minutes    Nustep  L3 x 4 min    Lat Pull  1.5 plate;20 reps    Cybex Row  1 plate;20 reps    Wall Wash  flexion, CW/CCW circles some scaption               PT Short Term Goals - 02/24/18 1427      PT SHORT TERM GOAL #1   Title  independent with initial HEP    Status  Achieved        PT Long Term Goals - 02/24/18 1427      PT LONG TERM GOAL #1   Title  understand fall risks int he home    Status  On-going  Plan - 02/26/18 1503    Clinical Impression Statement  Patient c/o pain in the shoulder with activity, did well, needs continuous cues to stay on track and to slow down.  Did well with teh gait except down hill was a little "out of control"    PT Next Visit Plan  slowly progress left shoulder ROM and work on functional balance    Consulted and Agree with Plan of Care  Patient       Patient will benefit from skilled therapeutic intervention in order to improve the following deficits and impairments:  Abnormal gait, Decreased cognition, Pain, Postural dysfunction, Decreased mobility, Decreased activity tolerance, Decreased range of motion, Decreased strength, Impaired UE functional use, Decreased balance, Difficulty walking  Visit Diagnosis: Stiffness of left shoulder, not elsewhere classified  Acute pain of left shoulder  Unsteadiness on feet     Problem List Patient Active Problem List   Diagnosis Date Noted  . Fall   . Faintness   . Essential hypertension   . Chronic diastolic heart failure ([place])   . Syncope 03/24/2014  . C2 cervical fracture ([place]) 03/24/2014    Sumner Boast., PT 02/26/2018, 3:05 PM  [address] [address] [place], [place], [address] Phone: [phone]   Fax:  [phone]  Name: Cheyenne Andrews MRN: [id number] Date of Birth: 09-09-1933

## 2018-03-03 ENCOUNTER — Encounter: Payer: Self-pay | Admitting: Physical Therapy

## 2018-03-03 ENCOUNTER — Ambulatory Visit: Payer: Medicare Other | Admitting: Physical Therapy

## 2018-03-03 DIAGNOSIS — R2681 Unsteadiness on feet: Secondary | ICD-10-CM

## 2018-03-03 DIAGNOSIS — M25612 Stiffness of left shoulder, not elsewhere classified: Secondary | ICD-10-CM | POA: Diagnosis not present

## 2018-03-03 DIAGNOSIS — M25512 Pain in left shoulder: Secondary | ICD-10-CM | POA: Diagnosis not present

## 2018-03-03 NOTE — Therapy (Signed)
[place] [address] [place], [place], [address] Phone: [phone]   Fax:  [phone]  Physical Therapy Treatment  Patient Details  Name: Cheyenne Andrews MRN: [id number] Date of Birth: 1933-09-18 Referring Provider (PT): Kathyrn Lass   Encounter Date: 03/03/2018  PT End of Session - 03/03/18 1422    Visit Number  4    Date for PT Re-Evaluation  04/18/18    PT Start Time  1342    PT Stop Time  1422    PT Time Calculation (min)  40 min    Activity Tolerance  Patient tolerated treatment well    Behavior During Therapy  [facility] for tasks assessed/performed       Past Medical History:  Diagnosis Date  . C2 cervical fracture [facility])     Past Surgical History:  Procedure Laterality Date  . ABDOMINAL HYSTERECTOMY    . CATARACT EXTRACTION    . SKIN CANCER EXCISION N/A early 2000s   removed from face     There were no vitals filed for this visit.  Subjective Assessment - 03/03/18 1347    Subjective  My shoulder is still bothering me.  Still difficult to get dressed    Currently in Pain?  Yes    Pain Score  2     Pain Location  Shoulder    Pain Orientation  Left    Aggravating Factors   any movement of the arm    Pain Relieving Factors  not moving the arm         [facility] PT Assessment - 03/03/18 0001      AROM   Left Shoulder Flexion  121 Degrees    Left Shoulder ABduction  100 Degrees    Left Shoulder Internal Rotation  40 Degrees    Left Shoulder External Rotation  55 Degrees                   [facility] Adult PT Treatment/Exercise - 03/03/18 0001      Shoulder Exercises: Standing   Other Standing Exercises  pass ball around waist both directions    Other Standing Exercises  Flex, Ext, IR AAROM cane 2x10       Shoulder Exercises: ROM/Strengthening   Nustep  level 4 x 6 minutes    Lat Pull  1.5 plate;20 reps    Cybex Row  1 plate;20 reps    Wall Wash  flexion, CW/CCW circles some scaption       Manual Therapy   Manual Therapy  Passive ROM    Passive ROM  of the [place] joint all motions to end ranges, she does tend to resist and compensate               PT Short Term Goals - 02/24/18 1427      PT SHORT TERM GOAL #1   Title  independent with initial HEP    Status  Achieved        PT Long Term Goals - 03/03/18 1425      PT LONG TERM GOAL #2   Title  increase left shoulder IR to 65 degrees    Status  On-going      PT LONG TERM GOAL #3   Title  increase left shoulder flexion to 140 degrees    Status  On-going      PT LONG TERM GOAL #4   Title  report no difficulty with dressing    Status  On-going      PT LONG TERM GOAL #5   Title  increase Berg balance test score to 46/56    Status  On-going            Plan - 03/03/18 1423    Clinical Impression Statement  Patient tends to resist and adjust body when she encounters pain, especially with the PROM, she needs a lot of manual and verbal cues to decreased this and allow ROM.  She did gain 15 degrees of motion for all GH joint motions.      PT Next Visit Plan  continue to work on ROM and function of the shoulder, instructed her to take a break every 15 minutes when she is reading and move the arm    Consulted and Agree with Plan of Care  Patient       Patient will benefit from skilled therapeutic intervention in order to improve the following deficits and impairments:  Abnormal gait, Decreased cognition, Pain, Postural dysfunction, Decreased mobility, Decreased activity tolerance, Decreased range of motion, Decreased strength, Impaired UE functional use, Decreased balance, Difficulty walking  Visit Diagnosis: Stiffness of left shoulder, not elsewhere classified  Acute pain of left shoulder  Unsteadiness on feet     Problem List Patient Active Problem List   Diagnosis Date Noted  . Fall   . Faintness   . Essential hypertension   . Chronic diastolic heart failure ([place])   . Syncope 03/24/2014   . C2 cervical fracture ([place]) 03/24/2014    Sumner Boast., PT 03/03/2018, 2:26 PM  [address] [place], [place], [address] Phone: [phone]   Fax:  [phone]  Name: Cheyenne Andrews MRN: [id number] Date of Birth: Aug 01, 1933

## 2018-03-05 ENCOUNTER — Encounter: Payer: Self-pay | Admitting: Physical Therapy

## 2018-03-05 ENCOUNTER — Ambulatory Visit: Payer: Medicare Other | Admitting: Physical Therapy

## 2018-03-05 DIAGNOSIS — M25612 Stiffness of left shoulder, not elsewhere classified: Secondary | ICD-10-CM

## 2018-03-05 DIAGNOSIS — M25512 Pain in left shoulder: Secondary | ICD-10-CM | POA: Diagnosis not present

## 2018-03-05 DIAGNOSIS — R2681 Unsteadiness on feet: Secondary | ICD-10-CM | POA: Diagnosis not present

## 2018-03-05 NOTE — Therapy (Signed)
[place] [address] [place], [place], [address] Phone: [phone]   Fax:  [phone]  Physical Therapy Treatment  Patient Details  Name: Cheyenne Andrews MRN: [id number] Date of Birth: Dec 15, 1933 Referring Provider (PT): Kathyrn Lass   Encounter Date: 03/05/2018  PT End of Session - 03/05/18 1434    Visit Number  5    Date for PT Re-Evaluation  04/18/18    PT Start Time  1354    PT Stop Time  1440    PT Time Calculation (min)  46 min    Activity Tolerance  Patient tolerated treatment well    Behavior During Therapy  [facility] for tasks assessed/performed       Past Medical History:  Diagnosis Date  . C2 cervical fracture [facility])     Past Surgical History:  Procedure Laterality Date  . ABDOMINAL HYSTERECTOMY    . CATARACT EXTRACTION    . SKIN CANCER EXCISION N/A early 2000s   removed from face     There were no vitals filed for this visit.  Subjective Assessment - 03/05/18 1400    Subjective  Patient reports that she does know if it is getting better, I told her that when we measured we can show that the motion is better    Currently in Pain?  No/denies                       [facility] Adult PT Treatment/Exercise - 03/05/18 0001      High Level Balance   High Level Balance Activities  Side stepping;Backward walking;Tandem walking    High Level Balance Comments  on airex head turns, eyes closed, ball toss, walking ball toss      Shoulder Exercises: Seated   Row  Left;20 reps;Theraband    Theraband Level (Shoulder Row)  Level 2 (Red)    External Rotation  Left;20 reps;Theraband    Theraband Level (Shoulder External Rotation)  Level 2 (Red)    Internal Rotation  Left;20 reps;Theraband    Theraband Level (Shoulder Internal Rotation)  Level 2 (Red)      Shoulder Exercises: Standing   Other Standing Exercises  pass ball around waist both directions    Other Standing Exercises  Flex, Ext, IR  AAROM cane 2x10       Shoulder Exercises: ROM/Strengthening   Nustep  level 4 x 6 minutes    Lat Pull  1.5 plate;20 reps    Cybex Row  1 plate;20 reps    Cybex Row Limitations  tried 20# unable      Manual Therapy   Manual Therapy  Passive ROM;Joint mobilization    Joint Mobilization  grade III GH joint mobs all    Passive ROM  of the [place] joint all motions to end ranges, she does tend to resist and compensate               PT Short Term Goals - 02/24/18 1427      PT SHORT TERM GOAL #1   Title  independent with initial HEP    Status  Achieved        PT Long Term Goals - 03/05/18 1437      PT LONG TERM GOAL #1   Title  understand fall risks int he home    Status  On-going      PT LONG TERM GOAL #2   Title  increase left shoulder IR to 65 degrees  Status  On-going            Plan - 03/05/18 1435    Clinical Impression Statement  Patient much better with her allowing motions today, able to get left hand above the waistband in the middle of the back with assist, she had difficulty walking and tossing ball needing constant cues, she also needed close CGA/supervision on the airex    PT Next Visit Plan  really encouraged her to do things at home for balance and exercise, she reports that she will sit for hours and read    Consulted and Agree with Plan of Care  Patient       Patient will benefit from skilled therapeutic intervention in order to improve the following deficits and impairments:  Abnormal gait, Decreased cognition, Pain, Postural dysfunction, Decreased mobility, Decreased activity tolerance, Decreased range of motion, Decreased strength, Impaired UE functional use, Decreased balance, Difficulty walking  Visit Diagnosis: Stiffness of left shoulder, not elsewhere classified  Acute pain of left shoulder  Unsteadiness on feet     Problem List Patient Active Problem List   Diagnosis Date Noted  . Fall   . Faintness   . Essential hypertension    . Chronic diastolic heart failure ([place])   . Syncope 03/24/2014  . C2 cervical fracture ([place]) 03/24/2014    Sumner Boast., PT 03/05/2018, 2:37 PM  [place] [address] [place], [place], [address] Phone: [phone]   Fax:  [phone]  Name: Alima Naser MRN: [id number] Date of Birth: Jan 25, 1933

## 2018-03-10 ENCOUNTER — Encounter: Payer: Self-pay | Admitting: Physical Therapy

## 2018-03-10 ENCOUNTER — Ambulatory Visit: Payer: Medicare Other | Admitting: Physical Therapy

## 2018-03-10 DIAGNOSIS — M25512 Pain in left shoulder: Secondary | ICD-10-CM

## 2018-03-10 DIAGNOSIS — R2681 Unsteadiness on feet: Secondary | ICD-10-CM

## 2018-03-10 DIAGNOSIS — M25612 Stiffness of left shoulder, not elsewhere classified: Secondary | ICD-10-CM | POA: Diagnosis not present

## 2018-03-10 NOTE — Therapy (Signed)
[address] [place], [place], [address] Phone: [phone]   Fax:  [phone]  Physical Therapy Treatment  Patient Details  Name: Cheyenne Andrews MRN: [id number] Date of Birth: 1933/04/10 Referring Provider (PT): Kathyrn Lass   Encounter Date: 03/10/2018  PT End of Session - 03/10/18 1512    Visit Number  6    Date for PT Re-Evaluation  04/18/18    PT Start Time  1430    PT Stop Time  1512    PT Time Calculation (min)  42 min    Activity Tolerance  Patient tolerated treatment well    Behavior During Therapy  [facility] for tasks assessed/performed       Past Medical History:  Diagnosis Date  . C2 cervical fracture [facility])     Past Surgical History:  Procedure Laterality Date  . ABDOMINAL HYSTERECTOMY    . CATARACT EXTRACTION    . SKIN CANCER EXCISION N/A early 2000s   removed from face     There were no vitals filed for this visit.  Subjective Assessment - 03/10/18 1429    Subjective  "Doing good"    Currently in Pain?  No/denies         [facility] PT Assessment - 03/10/18 0001      AROM   Left Shoulder Flexion  138 Degrees    Left Shoulder ABduction  130 Degrees    Left Shoulder Internal Rotation  90 Degrees    Left Shoulder External Rotation  81 Degrees                   [facility] Adult PT Treatment/Exercise - 03/10/18 0001      High Level Balance   High Level Balance Activities  Negotitating around obstacles;Negotiating over obstacles    High Level Balance Comments  Forward and side step over foam roll      Lumbar Exercises: Seated   Sit to Stand  5 reps   x3, ohp with red ball      Shoulder Exercises: Seated   Internal Rotation  Left;20 reps;Theraband    Theraband Level (Shoulder Internal Rotation)  Level 3 (Green)      Shoulder Exercises: Standing   Other Standing Exercises  pass ball around waist both directions      Shoulder Exercises: ROM/Strengthening   UBE (Upper  Arm Bike)  level 3 x 4 minutes    Nustep  level 4 x 6 minutes    Lat Pull  1.5 plate;20 reps    Cybex Row  20 reps;1.5 plate    Other ROM/Strengthening Exercises  chest press 5lb 2x10       Manual Therapy   Manual Therapy  Passive ROM;Joint mobilization    Joint Mobilization  grade III GH joint mobs all    Passive ROM  of the GH joint all motions to end ranges, she does tend to resist and compensate               PT Short Term Goals - 02/24/18 1427      PT SHORT TERM GOAL #1   Title  independent with initial HEP    Status  Achieved        PT Long Term Goals - 03/05/18 1437      PT LONG TERM GOAL #1   Title  understand fall risks int he home    Status  On-going      PT LONG TERM GOAL #  2   Title  increase left shoulder IR to 65 degrees    Status  On-going            Plan - 03/10/18 1512    Clinical Impression Statement  Pt has progressed increasing her L shoulder AROM. Postural cues needed with seated rows and lats. Cues needed to step wider with side step to have enough room to bring over the next LE. Pt ~ forgetful at times needed reminder for the exercises.     Rehab Potential  Fair    Clinical Impairments Affecting Rehab Potential  cognition    PT Frequency  2x / week    PT Duration  8 weeks    PT Next Visit Plan  really encouraged her to do things at home for balance and exercise, L shouldr ROM       Patient will benefit from skilled therapeutic intervention in order to improve the following deficits and impairments:  Abnormal gait, Decreased cognition, Pain, Postural dysfunction, Decreased mobility, Decreased activity tolerance, Decreased range of motion, Decreased strength, Impaired UE functional use, Decreased balance, Difficulty walking  Visit Diagnosis: Stiffness of left shoulder, not elsewhere classified  Acute pain of left shoulder  Unsteadiness on feet     Problem List Patient Active Problem List   Diagnosis Date Noted  . Fall   .  Faintness   . Essential hypertension   . Chronic diastolic heart failure ([place])   . Syncope 03/24/2014  . C2 cervical fracture ([place]) 03/24/2014    Scot Jun, PTA 03/10/2018, 3:16 PM  [place] [address] [place], [place], [address] Phone: [phone]   Fax:  [phone]  Name: Cheyenne Andrews MRN: [id number] Date of Birth: [birth year]

## 2018-03-12 ENCOUNTER — Ambulatory Visit: Payer: Medicare Other | Admitting: Physical Therapy

## 2018-03-12 ENCOUNTER — Encounter: Payer: Self-pay | Admitting: Physical Therapy

## 2018-03-12 DIAGNOSIS — M25512 Pain in left shoulder: Secondary | ICD-10-CM | POA: Diagnosis not present

## 2018-03-12 DIAGNOSIS — R2681 Unsteadiness on feet: Secondary | ICD-10-CM

## 2018-03-12 DIAGNOSIS — M25612 Stiffness of left shoulder, not elsewhere classified: Secondary | ICD-10-CM

## 2018-03-12 NOTE — Therapy (Signed)
[place], [place], [address] Phone: [phone]   Fax:  [phone]  Physical Therapy Treatment  Patient Details  Name: Cheyenne Andrews MRN: [id number] Date of Birth: 1933-07-29 Referring Provider (PT): Kathyrn Lass   Encounter Date: 03/12/2018  PT End of Session - 03/12/18 1440    Visit Number  7    Date for PT Re-Evaluation  04/18/18    PT Start Time  1355    PT Stop Time  1440    PT Time Calculation (min)  45 min    Activity Tolerance  Patient tolerated treatment well    Behavior During Therapy  [facility] for tasks assessed/performed       Past Medical History:  Diagnosis Date  . C2 cervical fracture [facility])     Past Surgical History:  Procedure Laterality Date  . ABDOMINAL HYSTERECTOMY    . CATARACT EXTRACTION    . SKIN CANCER EXCISION N/A early 2000s   removed from face     There were no vitals filed for this visit.  Subjective Assessment - 03/12/18 1358    Subjective  Reports no stumbles or falls, reports still difficulty with reaching behind her back    Currently in Pain?  No/denies                       [facility] Adult PT Treatment/Exercise - 03/12/18 0001      Shoulder Exercises: Standing   Other Standing Exercises  pass ball around waist both directions    Other Standing Exercises  cabinet reaching left hand 2# and 3#      Shoulder Exercises: ROM/Strengthening   UBE (Upper Arm Bike)  level 3 x 4 minutes    Nustep  level 4 x 6 minutes    Lat Pull  1.5 plate;20 reps    Cybex Row  20 reps;1.5 plate    Wall Wash  flexion, CW/CCW circles some scaption, overhead side to side      Manual Therapy   Manual Therapy  Passive ROM;Joint mobilization    Joint Mobilization  grade III GH joint mobs all    Passive ROM  of the [place] joint all motions to end ranges, she does tend to resist and compensate, able to get hand to bra               PT Short Term Goals -  02/24/18 1427      PT SHORT TERM GOAL #1   Title  independent with initial HEP    Status  Achieved        PT Long Term Goals - 03/12/18 1442      PT LONG TERM GOAL #1   Title  understand fall risks int he home    Status  Partially Met      PT LONG TERM GOAL #2   Title  increase left shoulder IR to 65 degrees    Status  Partially Met            Plan - 03/12/18 1440    Clinical Impression Statement  Patient making good progress, she has difficulty reaching head high cabinet with 2# and really struggles with 3#, passively I can get her left hand to her bra where it fastens.  She does worry about dizziness and reports that she is on a medication that a side effect is high on the list.  I asked her to call MD  PT Next Visit Plan  work on arm ROM and balance    Consulted and Agree with Plan of Care  Patient       Patient will benefit from skilled therapeutic intervention in order to improve the following deficits and impairments:  Abnormal gait, Decreased cognition, Pain, Postural dysfunction, Decreased mobility, Decreased activity tolerance, Decreased range of motion, Decreased strength, Impaired UE functional use, Decreased balance, Difficulty walking  Visit Diagnosis: Stiffness of left shoulder, not elsewhere classified  Acute pain of left shoulder  Unsteadiness on feet     Problem List Patient Active Problem List   Diagnosis Date Noted  . Fall   . Faintness   . Essential hypertension   . Chronic diastolic heart failure ([place])   . Syncope 03/24/2014  . C2 cervical fracture ([place]) 03/24/2014    Sumner Boast., PT 03/12/2018, 2:42 PM  [place] [address] [place], [place], [address] Phone: [phone]   Fax:  [phone]  Name: Cheyenne Andrews MRN: [id number] Date of Birth: Nov 18, 1933

## 2018-03-14 DIAGNOSIS — L821 Other seborrheic keratosis: Secondary | ICD-10-CM | POA: Diagnosis not present

## 2018-03-14 DIAGNOSIS — Z85828 Personal history of other malignant neoplasm of skin: Secondary | ICD-10-CM | POA: Diagnosis not present

## 2018-03-14 DIAGNOSIS — L720 Epidermal cyst: Secondary | ICD-10-CM | POA: Diagnosis not present

## 2018-03-14 DIAGNOSIS — D225 Melanocytic nevi of trunk: Secondary | ICD-10-CM | POA: Diagnosis not present

## 2018-03-14 DIAGNOSIS — L570 Actinic keratosis: Secondary | ICD-10-CM | POA: Diagnosis not present

## 2018-03-14 DIAGNOSIS — L853 Xerosis cutis: Secondary | ICD-10-CM | POA: Diagnosis not present

## 2018-06-06 DIAGNOSIS — F3341 Major depressive disorder, recurrent, in partial remission: Secondary | ICD-10-CM | POA: Diagnosis not present

## 2018-11-04 DIAGNOSIS — Z23 Encounter for immunization: Secondary | ICD-10-CM | POA: Diagnosis not present

## 2019-01-24 ENCOUNTER — Ambulatory Visit: Payer: Medicare Other | Attending: Internal Medicine

## 2019-01-24 DIAGNOSIS — Z23 Encounter for immunization: Secondary | ICD-10-CM

## 2019-01-24 NOTE — Progress Notes (Signed)
   Covid-19 Vaccination Clinic  Name:  Cheyenne Andrews    MRN: [id number] DOB: Apr 09, 1933  01/24/2019  Ms. Cheyenne Andrews was observed post Covid-19 immunization for 15 minutes without incidence. She was provided with Vaccine Information Sheet and instruction to access the V-Safe system.   [name] was instructed to call 911 with any severe reactions post vaccine: Marland Kitchen Difficulty breathing  . Swelling of your face and throat  . A fast heartbeat  . A bad rash all over your body  . Dizziness and weakness    Immunizations Administered    Name Date Dose VIS Date Route   Pfizer COVID-19 Vaccine 01/24/2019  2:55 PM 0.3 mL 12/26/2018 Intramuscular   Manufacturer: [place]   Lot: [id number]   [place]: [id number]

## 2019-02-04 DIAGNOSIS — N183 Chronic kidney disease, stage 3 unspecified: Secondary | ICD-10-CM | POA: Diagnosis not present

## 2019-02-04 DIAGNOSIS — Z0000 Encounter for general adult medical examination without abnormal findings: Secondary | ICD-10-CM | POA: Diagnosis not present

## 2019-02-04 DIAGNOSIS — M810 Age-related osteoporosis without current pathological fracture: Secondary | ICD-10-CM | POA: Diagnosis not present

## 2019-02-04 DIAGNOSIS — Z85828 Personal history of other malignant neoplasm of skin: Secondary | ICD-10-CM | POA: Diagnosis not present

## 2019-02-04 DIAGNOSIS — F3341 Major depressive disorder, recurrent, in partial remission: Secondary | ICD-10-CM | POA: Diagnosis not present

## 2019-02-10 DIAGNOSIS — Z961 Presence of intraocular lens: Secondary | ICD-10-CM | POA: Diagnosis not present

## 2019-02-10 DIAGNOSIS — H52203 Unspecified astigmatism, bilateral: Secondary | ICD-10-CM | POA: Diagnosis not present

## 2019-02-10 DIAGNOSIS — H26492 Other secondary cataract, left eye: Secondary | ICD-10-CM | POA: Diagnosis not present

## 2019-02-10 DIAGNOSIS — H524 Presbyopia: Secondary | ICD-10-CM | POA: Diagnosis not present

## 2019-02-11 ENCOUNTER — Ambulatory Visit: Payer: Medicare Other

## 2019-02-15 ENCOUNTER — Ambulatory Visit: Payer: Medicare Other | Attending: Internal Medicine

## 2019-02-15 DIAGNOSIS — Z23 Encounter for immunization: Secondary | ICD-10-CM | POA: Insufficient documentation

## 2019-02-15 NOTE — Progress Notes (Signed)
   Covid-19 Vaccination Clinic  Name:  Cheyenne Andrews    MRN: [id number] DOB: [birth year]  02/15/2019  [name] was observed post Covid-19 immunization for 15 minutes without incidence. She was provided with Vaccine Information Sheet and instruction to access the V-Safe system.   [name] was instructed to call 911 with any severe reactions post vaccine: Marland Kitchen Difficulty breathing  . Swelling of your face and throat  . A fast heartbeat  . A bad rash all over your body  . Dizziness and weakness    Immunizations Administered    Name Date Dose VIS Date Route   Pfizer COVID-19 Vaccine 02/15/2019 11:25 AM 0.3 mL 12/26/2018 Intramuscular   Manufacturer: [place]   Lot: [id number]   [place]: [id number]

## 2019-04-03 DIAGNOSIS — L308 Other specified dermatitis: Secondary | ICD-10-CM | POA: Diagnosis not present

## 2019-04-03 DIAGNOSIS — Z85828 Personal history of other malignant neoplasm of skin: Secondary | ICD-10-CM | POA: Diagnosis not present

## 2019-04-03 DIAGNOSIS — L720 Epidermal cyst: Secondary | ICD-10-CM | POA: Diagnosis not present

## 2019-08-11 DIAGNOSIS — Z961 Presence of intraocular lens: Secondary | ICD-10-CM | POA: Diagnosis not present

## 2019-08-11 DIAGNOSIS — H26492 Other secondary cataract, left eye: Secondary | ICD-10-CM | POA: Diagnosis not present

## 2019-10-28 DIAGNOSIS — Z23 Encounter for immunization: Secondary | ICD-10-CM | POA: Diagnosis not present

## 2019-11-25 DIAGNOSIS — Z23 Encounter for immunization: Secondary | ICD-10-CM | POA: Diagnosis not present

## 2020-02-11 DIAGNOSIS — H26492 Other secondary cataract, left eye: Secondary | ICD-10-CM | POA: Diagnosis not present

## 2020-02-11 DIAGNOSIS — H524 Presbyopia: Secondary | ICD-10-CM | POA: Diagnosis not present

## 2020-02-19 DIAGNOSIS — N183 Chronic kidney disease, stage 3 unspecified: Secondary | ICD-10-CM | POA: Diagnosis not present

## 2020-02-19 DIAGNOSIS — Z85828 Personal history of other malignant neoplasm of skin: Secondary | ICD-10-CM | POA: Diagnosis not present

## 2020-02-19 DIAGNOSIS — M810 Age-related osteoporosis without current pathological fracture: Secondary | ICD-10-CM | POA: Diagnosis not present

## 2020-02-19 DIAGNOSIS — R413 Other amnesia: Secondary | ICD-10-CM | POA: Diagnosis not present

## 2020-02-19 DIAGNOSIS — F3341 Major depressive disorder, recurrent, in partial remission: Secondary | ICD-10-CM | POA: Diagnosis not present

## 2020-08-09 DIAGNOSIS — H26492 Other secondary cataract, left eye: Secondary | ICD-10-CM | POA: Diagnosis not present

## 2020-09-12 DIAGNOSIS — D1801 Hemangioma of skin and subcutaneous tissue: Secondary | ICD-10-CM | POA: Diagnosis not present

## 2020-09-12 DIAGNOSIS — L814 Other melanin hyperpigmentation: Secondary | ICD-10-CM | POA: Diagnosis not present

## 2020-09-12 DIAGNOSIS — L720 Epidermal cyst: Secondary | ICD-10-CM | POA: Diagnosis not present

## 2020-09-12 DIAGNOSIS — L700 Acne vulgaris: Secondary | ICD-10-CM | POA: Diagnosis not present

## 2020-09-12 DIAGNOSIS — L578 Other skin changes due to chronic exposure to nonionizing radiation: Secondary | ICD-10-CM | POA: Diagnosis not present

## 2020-09-12 DIAGNOSIS — L570 Actinic keratosis: Secondary | ICD-10-CM | POA: Diagnosis not present

## 2020-09-12 DIAGNOSIS — Z85828 Personal history of other malignant neoplasm of skin: Secondary | ICD-10-CM | POA: Diagnosis not present

## 2020-09-12 DIAGNOSIS — L738 Other specified follicular disorders: Secondary | ICD-10-CM | POA: Diagnosis not present

## 2020-09-12 DIAGNOSIS — L821 Other seborrheic keratosis: Secondary | ICD-10-CM | POA: Diagnosis not present

## 2020-09-27 ENCOUNTER — Other Ambulatory Visit: Payer: Self-pay

## 2020-09-27 ENCOUNTER — Emergency Department (HOSPITAL_BASED_OUTPATIENT_CLINIC_OR_DEPARTMENT_OTHER): Payer: Medicare Other

## 2020-09-27 ENCOUNTER — Emergency Department (HOSPITAL_BASED_OUTPATIENT_CLINIC_OR_DEPARTMENT_OTHER)
Admission: EM | Admit: 2020-09-27 | Discharge: 2020-09-27 | Disposition: A | Discharge status: Home / Self Care | Payer: Medicare Other | Attending: Emergency Medicine | Admitting: Emergency Medicine

## 2020-09-27 ENCOUNTER — Encounter (HOSPITAL_BASED_OUTPATIENT_CLINIC_OR_DEPARTMENT_OTHER): Payer: Self-pay | Admitting: *Deleted

## 2020-09-27 DIAGNOSIS — S32039A Unspecified fracture of third lumbar vertebra, initial encounter for closed fracture: Secondary | ICD-10-CM | POA: Diagnosis not present

## 2020-09-27 DIAGNOSIS — M533 Sacrococcygeal disorders, not elsewhere classified: Secondary | ICD-10-CM | POA: Diagnosis not present

## 2020-09-27 DIAGNOSIS — N83201 Unspecified ovarian cyst, right side: Secondary | ICD-10-CM | POA: Diagnosis not present

## 2020-09-27 DIAGNOSIS — M8588 Other specified disorders of bone density and structure, other site: Secondary | ICD-10-CM | POA: Insufficient documentation

## 2020-09-27 DIAGNOSIS — S12390A Other displaced fracture of fourth cervical vertebra, initial encounter for closed fracture: Secondary | ICD-10-CM | POA: Diagnosis not present

## 2020-09-27 DIAGNOSIS — W06XXXA Fall from bed, initial encounter: Secondary | ICD-10-CM | POA: Diagnosis not present

## 2020-09-27 DIAGNOSIS — S129XXA Fracture of neck, unspecified, initial encounter: Secondary | ICD-10-CM

## 2020-09-27 DIAGNOSIS — S12400A Unspecified displaced fracture of fifth cervical vertebra, initial encounter for closed fracture: Secondary | ICD-10-CM | POA: Diagnosis not present

## 2020-09-27 DIAGNOSIS — S0990XA Unspecified injury of head, initial encounter: Secondary | ICD-10-CM | POA: Diagnosis not present

## 2020-09-27 DIAGNOSIS — S3993XA Unspecified injury of pelvis, initial encounter: Secondary | ICD-10-CM | POA: Diagnosis not present

## 2020-09-27 DIAGNOSIS — S32049A Unspecified fracture of fourth lumbar vertebra, initial encounter for closed fracture: Secondary | ICD-10-CM | POA: Diagnosis not present

## 2020-09-27 DIAGNOSIS — S199XXA Unspecified injury of neck, initial encounter: Secondary | ICD-10-CM | POA: Diagnosis present

## 2020-09-27 DIAGNOSIS — M542 Cervicalgia: Secondary | ICD-10-CM | POA: Diagnosis not present

## 2020-09-27 DIAGNOSIS — Z90710 Acquired absence of both cervix and uterus: Secondary | ICD-10-CM | POA: Diagnosis not present

## 2020-09-27 DIAGNOSIS — I5032 Chronic diastolic (congestive) heart failure: Secondary | ICD-10-CM | POA: Insufficient documentation

## 2020-09-27 DIAGNOSIS — Z7982 Long term (current) use of aspirin: Secondary | ICD-10-CM | POA: Insufficient documentation

## 2020-09-27 DIAGNOSIS — S12190A Other displaced fracture of second cervical vertebra, initial encounter for closed fracture: Secondary | ICD-10-CM | POA: Diagnosis not present

## 2020-09-27 DIAGNOSIS — R519 Headache, unspecified: Secondary | ICD-10-CM | POA: Insufficient documentation

## 2020-09-27 DIAGNOSIS — I110 Hypertensive heart disease with heart failure: Secondary | ICD-10-CM | POA: Diagnosis not present

## 2020-09-27 DIAGNOSIS — S12100A Unspecified displaced fracture of second cervical vertebra, initial encounter for closed fracture: Secondary | ICD-10-CM | POA: Diagnosis not present

## 2020-09-27 DIAGNOSIS — M4312 Spondylolisthesis, cervical region: Secondary | ICD-10-CM | POA: Diagnosis not present

## 2020-09-27 DIAGNOSIS — S12200A Unspecified displaced fracture of third cervical vertebra, initial encounter for closed fracture: Secondary | ICD-10-CM | POA: Diagnosis not present

## 2020-09-27 LAB — BASIC METABOLIC PANEL
Anion gap: 7 (ref 5–15)
BUN: 11 mg/dL (ref 8–23)
CO2: 26 mmol/L (ref 22–32)
Calcium: 8.7 mg/dL — ABNORMAL LOW (ref 8.9–10.3)
Chloride: 104 mmol/L (ref 98–111)
Creatinine, Ser: 0.81 mg/dL (ref 0.44–1.00)
GFR, Estimated: >60 mL/min (ref >60)
Glucose, Bld: 114 mg/dL — ABNORMAL HIGH (ref 70–99)
Potassium: 4.5 mmol/L (ref 3.5–5.1)
Sodium: 137 mmol/L (ref 135–145)

## 2020-09-27 LAB — CBC WITH DIFFERENTIAL/PLATELET
Abs Immature Granulocytes: 0.09 10*3/uL — ABNORMAL HIGH (ref 0.00–0.07)
Basophils Absolute: 0 10*3/uL (ref 0.0–0.1)
Basophils Relative: 0 %
Eosinophils Absolute: 0 10*3/uL (ref 0.0–0.5)
Eosinophils Relative: 0 %
HCT: 44.7 % (ref 36.0–46.0)
Hemoglobin: 15.3 g/dL — ABNORMAL HIGH (ref 12.0–15.0)
Immature Granulocytes: 1 %
Lymphocytes Relative: 6 %
Lymphs Abs: 0.7 10*3/uL (ref 0.7–4.0)
MCH: 32.8 pg (ref 26.0–34.0)
MCHC: 34.2 g/dL (ref 30.0–36.0)
MCV: 95.7 fL (ref 80.0–100.0)
Monocytes Absolute: 0.9 10*3/uL (ref 0.1–1.0)
Monocytes Relative: 7 %
Neutro Abs: 10.3 10*3/uL — ABNORMAL HIGH (ref 1.7–7.7)
Neutrophils Relative %: 86 %
Platelets: 201 10*3/uL (ref 150–400)
RBC: 4.67 MIL/uL (ref 3.87–5.11)
RDW: 13.6 % (ref 11.5–15.5)
WBC: 12 10*3/uL — ABNORMAL HIGH (ref 4.0–10.5)
nRBC: 0 % (ref 0.0–0.2)

## 2020-09-27 MED ORDER — HYDROCODONE-ACETAMINOPHEN 5-325 MG PO TABS: 0.5000 | ORAL_TABLET | Freq: Four times a day (QID) | ORAL | 0 refills | Status: DC | PRN

## 2020-09-27 MED ORDER — FENTANYL CITRATE PF 50 MCG/ML IJ SOSY
25.0000 ug | PREFILLED_SYRINGE | Freq: Once | INTRAMUSCULAR | Status: AC
  Administered 2020-09-27: 25 ug via INTRAVENOUS
  Filled 2020-09-27: qty 1

## 2020-09-27 MED ORDER — METHOCARBAMOL 500 MG PO TABS: 500.0000 mg | ORAL_TABLET | Freq: Two times a day (BID) | ORAL | 0 refills | Status: DC | PRN

## 2020-09-27 NOTE — ED Triage Notes (Signed)
C/o fall from bed x 3 hrs ago c/o neck pain

## 2020-09-27 NOTE — ED Provider Notes (Signed)
[place] [facility] EMERGENCY DEPARTMENT Provider Note   CSN: [id number] Arrival date & time: 09/27/20  1716     History Chief Complaint  Patient presents with   Lytle Michaels    Cheyenne Andrews is a 87 y.o. female presenting for evaluation after fall.  Patient states she was properly tried to get out of bed when she fell out of her bed, landing onto the carpet.  She hit her head on the chair.  She reports acute onset severe neck pain.  History of previous C2 fracture.  She also reports a lot of pain in her tailbone.  She has some mild headache, and did hit her head, but did not lose consciousness.  She was unable to get off the floor for several hours until somebody assisted her.  Since then, she has ambulated, but does report a lot of pain.  She is not taking anything including Tylenol ibuprofen.  No vision changes, slurred speech, numbness or weakness of the arms or legs, chest pain, nausea, vomiting, abdominal pain.  Additional history taken chart reviewed.  Patient with a history of hypertension, CHF, C2 fracture   HPI     Past Medical History:  Diagnosis Date   C2 cervical fracture [facility])     Patient Active Problem List   Diagnosis Date Noted   Fall    Faintness    Essential hypertension    Chronic diastolic heart failure ([place])    Syncope 03/24/2014   C2 cervical fracture ([place]) 03/24/2014    Past Surgical History:  Procedure Laterality Date   ABDOMINAL HYSTERECTOMY     CATARACT EXTRACTION     SKIN CANCER EXCISION N/A early 2000s   removed from face      OB History   No obstetric history on file.     History reviewed. No pertinent family history.  Social History   Tobacco Use   Smoking status: Never   Smokeless tobacco: Never  Substance Use Topics   Alcohol use: No   Drug use: No    Home Medications Prior to Admission medications   Medication Sig Start Date End Date Taking? Authorizing Provider  HYDROcodone-acetaminophen (NORCO/VICODIN) 5-325 MG  tablet Take 0.5 tablets by mouth every 6 (six) hours as needed. 09/27/20  Yes Janylah Belgrave, PA-C  methocarbamol (ROBAXIN) 500 MG tablet Take 1 tablet (500 mg total) by mouth 2 (two) times daily as needed for muscle spasms. 09/27/20  Yes Cheynne Virden, PA-C  acetaminophen (TYLENOL) 500 MG tablet Take 1 tablet (500 mg total) by mouth every 6 (six) hours as needed for moderate pain (or Fever >/= 101). 03/26/14   Barton Dubois, MD  aspirin EC 81 MG tablet Take 1 tablet (81 mg total) by mouth daily. 03/26/14   Barton Dubois, MD  Cholecalciferol (VITAMIN D-3) 1000 UNITS CAPS Take 1 capsule by mouth 2 (two) times daily.    [provider]  FLUoxetine (PROZAC) 10 MG capsule Take 10 mg by mouth daily.    [provider]  Hypromellose (GENTEAL MILD OP) Apply 1-2 drops to eye 3 (three) times daily as needed (for eye dryness).    [provider]  ibandronate (BONIVA) 150 MG tablet Take 150 mg by mouth every 30 (thirty) days. Take in the morning with a full glass of water, on an empty stomach, and do not take anything else by mouth or lie down for the next 30 min.    [provider]  Multiple Vitamin (MULTIVITAMIN) tablet Take 1 tablet by mouth  daily.    [provider]    Allergies    Patient has no known allergies.  Review of Systems   Review of Systems  Musculoskeletal:  Positive for arthralgias, back pain and neck pain.  All other systems reviewed and are negative.  Physical Exam Updated Vital Signs BP (!) 151/72   Pulse 67   Temp 98.4 F (36.9 C) (Oral)   Resp 20   Ht '5\' 3"'$  (1.6 m)   Wt 54.4 kg   SpO2 96%   BMI 21.26 kg/m   Physical Exam Vitals and nursing note reviewed.  Constitutional:      General: She is not in acute distress.    Appearance: Normal appearance.     Comments: Nontoxic  HENT:     Head: Normocephalic and atraumatic.  Eyes:     Conjunctiva/sclera: Conjunctivae normal.     Pupils: Pupils are equal, round, and  reactive to light.  Neck:     Comments: In c-collar Cardiovascular:     Rate and Rhythm: Normal rate and regular rhythm.     Pulses: Normal pulses.  Pulmonary:     Effort: Pulmonary effort is normal. No respiratory distress.     Breath sounds: Normal breath sounds. No wheezing.     Comments: Speaking in full sentences.  Clear lung sounds in all fields. Abdominal:     General: There is no distension.     Palpations: Abdomen is soft. There is no mass.     Tenderness: There is no abdominal tenderness. There is no guarding or rebound.  Musculoskeletal:        General: Normal range of motion.     Comments: Tenderness palpation over the sacrum/coccyx.  No tenderness palpation over the ischium.  No tenderness palpation of midline back.  No contusions or hematomas.  Skin:    General: Skin is warm and dry.     Capillary Refill: Capillary refill takes less than 2 seconds.  Neurological:     Mental Status: She is alert and oriented to person, place, and time.     Comments: Strength and sensation intact x4  Psychiatric:        Mood and Affect: Mood and affect normal.        Speech: Speech normal.        Behavior: Behavior normal.    ED Results / Procedures / Treatments   Labs (all labs ordered are listed, but only abnormal results are displayed) Labs Reviewed  BASIC METABOLIC PANEL - Abnormal; Notable for the following components:      Result Value   Glucose, Bld 114 (*)    Calcium 8.7 (*)    All other components within normal limits  CBC WITH DIFFERENTIAL/PLATELET - Abnormal; Notable for the following components:   WBC 12.0 (*)    Hemoglobin 15.3 (*)    Neutro Abs 10.3 (*)    Abs Immature Granulocytes 0.09 (*)    All other components within normal limits  CBC WITH DIFFERENTIAL/PLATELET    EKG None  Radiology DG Chest 2 View  Result Date: 09/27/2020 CLINICAL DATA:  Status post fall from bed 3 hours ago.  Neck pain. EXAM: CHEST - 2 VIEW COMPARISON:  None. FINDINGS: The heart  and mediastinal contours are within normal limits. No focal consolidation. No pulmonary edema. No pleural effusion. No pneumothorax. No acute osseous abnormality. IMPRESSION: No active cardiopulmonary disease. Electronically Signed   By: Iven Finn M.D.   On: 09/27/2020 19:06   CT HEAD  WO CONTRAST (5MM)  Result Date: 09/27/2020 CLINICAL DATA:  [place], head and neck trauma EXAM: CT HEAD WITHOUT CONTRAST TECHNIQUE: Contiguous axial images were obtained from the base of the skull through the vertex without intravenous contrast. COMPARISON:  02/07/2016 FINDINGS: Brain: Progressive confluent hypodensities throughout the periventricular white matter consistent with chronic small vessel ischemic changes. No signs of acute infarct or hemorrhage. Lateral ventricles and midline structures are unremarkable. No acute extra-axial fluid collections. No mass effect. Vascular: Stable atherosclerosis.  No hyperdense vessel. Skull: Normal. Negative for fracture or focal lesion. Sinuses/Orbits: No acute finding. Other: None. IMPRESSION: 1. Chronic small vessel ischemic changes as above. No acute intracranial process. Electronically Signed   By: Randa Ngo M.D.   On: 09/27/2020 19:00   CT Cervical Spine Wo Contrast  Result Date: 09/27/2020 CLINICAL DATA:  [place], head and neck trauma EXAM: CT CERVICAL SPINE WITHOUT CONTRAST TECHNIQUE: Multidetector CT imaging of the cervical spine was performed without intravenous contrast. Multiplanar CT image reconstructions were also generated. COMPARISON:  06/01/2014 FINDINGS: Alignment: There is mild 2 mm anterolisthesis of C4 on C5. Otherwise alignment is anatomic. Skull base and vertebrae: There is an acute anterior compression deformity involving the superior anterior aspect of the C5 vertebral body, with less than 25% loss of height. At C2, there is a minimally displaced fracture through the right lamina extending into the right C2/C3 facet joint. Fracture line extends posteriorly  through the spinous process and the posterior aspect of the left lamina. The left facet joint is not involved. At C3, there is a minimally displaced comminuted fracture through the spinous process. At C4, there is a minimally displaced comminuted fracture through the spinous process. Soft tissues and spinal canal: No prevertebral fluid or swelling. No visible canal hematoma. Disc levels: Multilevel facet hypertrophy greatest at C2-3, C4-5, and C5-6. There is mild diffuse spondylosis greatest at C6-7 and C7-T1. Upper chest: Central airway is patent.  Lung apices are clear. Other: Reconstructed images demonstrate no additional findings. IMPRESSION: 1. Mild anterior wedge compression deformity of the C5 vertebral body, with less than 25% loss of height. No extension to the posterior elements are pedicles. No retropulsion. 2. Fracture through the bilateral C2 lamina and spinous process. The right laminar fracture extends into the right C2/C3 facet joint. Anatomic alignment. 3. Mildly displaced and comminuted spinous process fractures at C3 and C4. 4. Mild 2 mm anterolisthesis of C4 relative to C5, new since prior exam. While this could be due to chronic spondylosis and facet hypertrophy, acute ligamentous injury cannot be excluded given the cervical spine findings described above. Critical Value/emergent results were called by telephone at the time of interpretation on 09/27/2020 at 7:14 pm to provider [facility] , who verbally acknowledged these results. Electronically Signed   By: Randa Ngo M.D.   On: 09/27/2020 19:23   CT PELVIS WO CONTRAST  Result Date: 09/27/2020 CLINICAL DATA:  Pelvic trauma.  Fall.  Sacral pain EXAM: CT PELVIS WITHOUT CONTRAST TECHNIQUE: Multidetector CT imaging of the pelvis was performed following the standard protocol without intravenous contrast. COMPARISON:  None. FINDINGS: Urinary Tract:  Distal ureters and bladder normal. Bowel:  Unremarkable Vascular/Lymphatic: No adenopathy  Reproductive: Post hysterectomy. Large ovoid cystic lesion associated of the RIGHT ovary measures 6.3 x 4.9 cm. Cyst has simple fluid attenuation without evidence of complexity on noncontrast exam. Other:  No free fluid. Musculoskeletal: No evidence of fracture of the sacrum or pelvis. No acute findings of the coccyx. Diffuse osteopenia noted. No hematoma  within the musculature or evidence of trauma. IMPRESSION: 1. No evidence of acute fracture in the sacrum or pelvis. 2. Diffuse osteopenia the sacrum and pelvis. 3. Large ovoid lesion of the RIGHT ovary has benign appearance on noncontrast CT. Recommend follow-up ultrasound 6 months. This recommendation follows ACR consensus guidelines: White Paper of the ACR Incidental Findings Committee II on Adnexal Findings. J Am Coll Radiol [phone]. Electronically Signed   By: Suzy Bouchard M.D.   On: 09/27/2020 19:14    Procedures Procedures   Medications Ordered in ED Medications  fentaNYL (SUBLIMAZE) injection 25 mcg (25 mcg Intravenous Given 09/27/20 1836)    ED Course  I have reviewed the triage vital signs and the nursing notes.  Pertinent labs & imaging results that were available during my care of the patient were reviewed by me and considered in my medical decision making (see chart for details).    MDM Rules/Calculators/A&P                           Pt presenting for evaluation after a fall. On exam, pt appears nontoxic, she is neurovascularly intact.  She is in a c-collar.  No chest palpation of midline back until the coccyx left sacrum.  In the setting of head trauma, will obtain CT.  CT C-spine due to neck pain.  Obtain CT of the pelvis to ensure no sacral or coccyx fracture.  Chest x-ray due to fall.  Basic labs obtained.  Labs overall reassuring.  Chest x-ray viewed and independently interpreted by me, no fracture dislocation.  CT of the head and pelvis negative for acute findings.  CT C-spine shows multiple cervical fractures  including compression fracture of C5, bilateral laminal fractures of C2 that extends into the facet on the right side, C3 and C4 spinous process fractures and anterolisthesis of C4/C5.  Will consult with neurosurgery.  Discussed with Dr. Kathyrn Sheriff from neurosurgery who reviewed the imaging.  Recommends patient be placed on aspirin and follow-up in the office.  Patient ambulated in the ED without difficulty.  I discussed findings with patient and patient's daughter.  As patient lives alone, I encouraged patient's daughter to stay with her to ensure no repeat falls or worsening symptoms.  Discussed pain control versus risks of falling.  Discussed importance of close follow-up in the outpatient setting.  At this time, patient appears safe for discharge.  Return cautions given.  Patient and daughter state they understand and agree to plan  Final Clinical Impression(s) / ED Diagnoses Final diagnoses:  Closed fracture of cervical vertebra, unspecified cervical vertebral level, initial encounter [facility])    Rx / DC Orders ED Discharge Orders          Ordered    methocarbamol (ROBAXIN) 500 MG tablet  2 times daily PRN        09/27/20 2047    HYDROcodone-acetaminophen (NORCO/VICODIN) 5-325 MG tablet  Every 6 hours PRN        09/27/20 2047             Franchot Heidelberg, PA-C 09/27/20 2332    Truddie Hidden, MD 09/28/20 1455

## 2020-09-27 NOTE — ED Notes (Signed)
Ambulated in hall without assistance, tolerated well.

## 2020-09-27 NOTE — Discharge Instructions (Addendum)
Your CT scan of your neck showed multiple fractures in your neck.  It is very important that you wear the collar at all times until you follow-up with the neurosurgeon listed below. Use Tylenol ibuprofen as needed for mild to moderate pain.  You may try a muscle relaxer as needed for stiffness or soreness. Use Norco as needed for severe breakthrough pain.  Have caution, this may increase her risk of falls.  Start with a half a tablet, and you may take the second half as needed. Call the office listed below to set up a follow-up appointment. Return to the emergency room if you develop severe worsening head pain, numbness in your arms, difficulty breathing, worsening neck pain, any new, worsneing, or concerning symptoms.

## 2020-10-03 DIAGNOSIS — R030 Elevated blood-pressure reading, without diagnosis of hypertension: Secondary | ICD-10-CM | POA: Diagnosis not present

## 2020-10-03 DIAGNOSIS — S12490A Other displaced fracture of fifth cervical vertebra, initial encounter for closed fracture: Secondary | ICD-10-CM | POA: Diagnosis not present

## 2020-11-14 DIAGNOSIS — S12490A Other displaced fracture of fifth cervical vertebra, initial encounter for closed fracture: Secondary | ICD-10-CM | POA: Diagnosis not present

## 2020-11-15 ENCOUNTER — Other Ambulatory Visit: Payer: Self-pay | Admitting: Neurosurgery

## 2020-11-15 DIAGNOSIS — S12490A Other displaced fracture of fifth cervical vertebra, initial encounter for closed fracture: Secondary | ICD-10-CM

## 2020-11-16 ENCOUNTER — Other Ambulatory Visit: Payer: Self-pay

## 2020-11-16 ENCOUNTER — Ambulatory Visit
Admission: RE | Admit: 2020-11-16 | Discharge: 2020-11-16 | Disposition: A | Discharge status: Home / Self Care | Payer: Medicare Other | Source: Ambulatory Visit | Attending: Neurosurgery | Admitting: Neurosurgery

## 2020-11-16 DIAGNOSIS — S12490A Other displaced fracture of fifth cervical vertebra, initial encounter for closed fracture: Secondary | ICD-10-CM

## 2020-11-16 DIAGNOSIS — M47812 Spondylosis without myelopathy or radiculopathy, cervical region: Secondary | ICD-10-CM | POA: Diagnosis not present

## 2020-11-16 DIAGNOSIS — S12390A Other displaced fracture of fourth cervical vertebra, initial encounter for closed fracture: Secondary | ICD-10-CM | POA: Diagnosis not present

## 2020-11-16 DIAGNOSIS — S12100A Unspecified displaced fracture of second cervical vertebra, initial encounter for closed fracture: Secondary | ICD-10-CM | POA: Diagnosis not present

## 2020-11-16 DIAGNOSIS — S12200A Unspecified displaced fracture of third cervical vertebra, initial encounter for closed fracture: Secondary | ICD-10-CM | POA: Diagnosis not present

## 2020-11-21 DIAGNOSIS — Z23 Encounter for immunization: Secondary | ICD-10-CM | POA: Diagnosis not present

## 2020-12-15 DIAGNOSIS — R030 Elevated blood-pressure reading, without diagnosis of hypertension: Secondary | ICD-10-CM | POA: Diagnosis not present

## 2020-12-15 DIAGNOSIS — S12490A Other displaced fracture of fifth cervical vertebra, initial encounter for closed fracture: Secondary | ICD-10-CM | POA: Diagnosis not present

## 2020-12-15 DIAGNOSIS — S12100D Unspecified displaced fracture of second cervical vertebra, subsequent encounter for fracture with routine healing: Secondary | ICD-10-CM | POA: Diagnosis not present

## 2021-03-16 DIAGNOSIS — S12490A Other displaced fracture of fifth cervical vertebra, initial encounter for closed fracture: Secondary | ICD-10-CM | POA: Diagnosis not present

## 2021-03-16 DIAGNOSIS — R030 Elevated blood-pressure reading, without diagnosis of hypertension: Secondary | ICD-10-CM | POA: Diagnosis not present

## 2021-04-13 ENCOUNTER — Encounter (HOSPITAL_COMMUNITY): Payer: Self-pay | Admitting: Radiology

## 2021-09-16 ENCOUNTER — Emergency Department (HOSPITAL_BASED_OUTPATIENT_CLINIC_OR_DEPARTMENT_OTHER)
Admission: EM | Admit: 2021-09-16 | Discharge: 2021-09-17 | Disposition: A | Discharge status: Home / Self Care | Payer: Medicare Other | Attending: Emergency Medicine | Admitting: Emergency Medicine

## 2021-09-16 ENCOUNTER — Other Ambulatory Visit: Payer: Self-pay

## 2021-09-16 DIAGNOSIS — Z7982 Long term (current) use of aspirin: Secondary | ICD-10-CM | POA: Insufficient documentation

## 2021-09-16 DIAGNOSIS — S199XXA Unspecified injury of neck, initial encounter: Secondary | ICD-10-CM | POA: Diagnosis not present

## 2021-09-16 DIAGNOSIS — J984 Other disorders of lung: Secondary | ICD-10-CM | POA: Diagnosis not present

## 2021-09-16 DIAGNOSIS — S161XXA Strain of muscle, fascia and tendon at neck level, initial encounter: Secondary | ICD-10-CM | POA: Insufficient documentation

## 2021-09-16 DIAGNOSIS — Y92000 Kitchen of unspecified non-institutional (private) residence as  the place of occurrence of the external cause: Secondary | ICD-10-CM | POA: Diagnosis not present

## 2021-09-16 DIAGNOSIS — W19XXXA Unspecified fall, initial encounter: Secondary | ICD-10-CM

## 2021-09-16 DIAGNOSIS — Y9301 Activity, walking, marching and hiking: Secondary | ICD-10-CM | POA: Insufficient documentation

## 2021-09-16 DIAGNOSIS — S8002XA Contusion of left knee, initial encounter: Secondary | ICD-10-CM | POA: Insufficient documentation

## 2021-09-16 DIAGNOSIS — I10 Essential (primary) hypertension: Secondary | ICD-10-CM | POA: Diagnosis not present

## 2021-09-16 DIAGNOSIS — M1712 Unilateral primary osteoarthritis, left knee: Secondary | ICD-10-CM | POA: Diagnosis not present

## 2021-09-16 DIAGNOSIS — S0101XA Laceration without foreign body of scalp, initial encounter: Secondary | ICD-10-CM | POA: Insufficient documentation

## 2021-09-16 DIAGNOSIS — W010XXA Fall on same level from slipping, tripping and stumbling without subsequent striking against object, initial encounter: Secondary | ICD-10-CM | POA: Insufficient documentation

## 2021-09-16 DIAGNOSIS — S63502A Unspecified sprain of left wrist, initial encounter: Secondary | ICD-10-CM | POA: Diagnosis not present

## 2021-09-16 DIAGNOSIS — M25532 Pain in left wrist: Secondary | ICD-10-CM | POA: Diagnosis not present

## 2021-09-16 DIAGNOSIS — S0990XA Unspecified injury of head, initial encounter: Secondary | ICD-10-CM

## 2021-09-16 DIAGNOSIS — Z79899 Other long term (current) drug therapy: Secondary | ICD-10-CM | POA: Diagnosis not present

## 2021-09-16 NOTE — ED Triage Notes (Signed)
Pt had mechanical fall tonight around 10 pm. ~1 inch laceration to anteior head, just above forehead. Bleeding controlled. Denies blood thinners, loc, hip pain, back pain.   Pt c/o left wrist pain and left knee pain.

## 2021-09-17 ENCOUNTER — Emergency Department (HOSPITAL_BASED_OUTPATIENT_CLINIC_OR_DEPARTMENT_OTHER): Payer: Medicare Other

## 2021-09-17 ENCOUNTER — Encounter (HOSPITAL_BASED_OUTPATIENT_CLINIC_OR_DEPARTMENT_OTHER): Payer: Self-pay | Admitting: Emergency Medicine

## 2021-09-17 DIAGNOSIS — M25532 Pain in left wrist: Secondary | ICD-10-CM | POA: Diagnosis not present

## 2021-09-17 DIAGNOSIS — S0990XA Unspecified injury of head, initial encounter: Secondary | ICD-10-CM | POA: Diagnosis not present

## 2021-09-17 DIAGNOSIS — S199XXA Unspecified injury of neck, initial encounter: Secondary | ICD-10-CM | POA: Diagnosis not present

## 2021-09-17 DIAGNOSIS — J984 Other disorders of lung: Secondary | ICD-10-CM | POA: Diagnosis not present

## 2021-09-17 DIAGNOSIS — M1712 Unilateral primary osteoarthritis, left knee: Secondary | ICD-10-CM | POA: Diagnosis not present

## 2021-09-17 DIAGNOSIS — S0101XA Laceration without foreign body of scalp, initial encounter: Secondary | ICD-10-CM | POA: Diagnosis not present

## 2021-09-17 MED ORDER — LIDOCAINE HCL (PF) 1 % IJ SOLN
5.0000 mL | Freq: Once | INTRAMUSCULAR | Status: AC
  Administered 2021-09-17: 5 mL via INTRADERMAL
  Filled 2021-09-17: qty 5

## 2021-09-17 NOTE — ED Notes (Addendum)
RN cleaned pt's head, neck, and face and removed dried and wet blood using gauze and sterile water. Located a single laceration to right forehead just above the hairline, approximately 1.5 inches in length front to back. Approximated edges with bleeding controlled. Noted what looks like several paint chips in and around the wound while cleaning. Pt is unsure of last tetanus shot. Denies hip/pelvic pain but reports pain to left wrist and left knee with no significant limitation to ROM. Patient was then transported to CT.

## 2021-09-17 NOTE — ED Provider Notes (Signed)
[facility] EMERGENCY DEPARTMENT Provider Note   CSN: [id number] Arrival date & time: 09/16/21  2354     History  Chief Complaint  Patient presents with   Head Injury   Fall    Cheyenne Andrews is a 88 y.o. female.  Patient is an 88 year old female with past medical history of hypertension, and prior C-spine fractures.  Patient presenting today for evaluation of a fall.  She was apparently walking to the kitchen with a dish in her hand when she tripped and fell.  She believes she struck her head on the wall trim.  She denies having lost consciousness.  She did sustain a laceration to the scalp just above the hairline.  Bleeding is currently controlled with direct pressure.  Patient denies to me she is having neck pain, but does describe some pain in her left wrist and left knee.  She does describe a headache.  The history is provided by the patient.       Home Medications Prior to Admission medications   Medication Sig Start Date End Date Taking? Authorizing Provider  acetaminophen (TYLENOL) 500 MG tablet Take 1 tablet (500 mg total) by mouth every 6 (six) hours as needed for moderate pain (or Fever >/= 101). 03/26/14   Barton Dubois, MD  aspirin EC 81 MG tablet Take 1 tablet (81 mg total) by mouth daily. 03/26/14   Barton Dubois, MD  Cholecalciferol (VITAMIN D-3) 1000 UNITS CAPS Take 1 capsule by mouth 2 (two) times daily.    [provider]  FLUoxetine (PROZAC) 10 MG capsule Take 10 mg by mouth daily.    [provider]  HYDROcodone-acetaminophen (NORCO/VICODIN) 5-325 MG tablet Take 0.5 tablets by mouth every 6 (six) hours as needed. 09/27/20   Caccavale, Sophia, PA-C  Hypromellose (GENTEAL MILD OP) Apply 1-2 drops to eye 3 (three) times daily as needed (for eye dryness).    [provider]  ibandronate (BONIVA) 150 MG tablet Take 150 mg by mouth every 30 (thirty) days. Take in the morning with a full glass of water, on an empty stomach, and do  not take anything else by mouth or lie down for the next 30 min.    [provider]  methocarbamol (ROBAXIN) 500 MG tablet Take 1 tablet (500 mg total) by mouth 2 (two) times daily as needed for muscle spasms. 09/27/20   Caccavale, Sophia, PA-C  Multiple Vitamin (MULTIVITAMIN) tablet Take 1 tablet by mouth daily.    [provider]      Allergies    Patient has no known allergies.    Review of Systems   Review of Systems  All other systems reviewed and are negative.   Physical Exam Updated Vital Signs BP (!) 159/69   Pulse 69   Temp 98 F (36.7 C) (Oral)   Resp 17   Ht '5\' 3"'$  (1.6 m)   Wt 55 kg   SpO2 94%   BMI 21.48 kg/m  Physical Exam Vitals and nursing note reviewed.  Constitutional:      General: She is not in acute distress.    Appearance: She is well-developed. She is not diaphoretic.  HENT:     Head: Normocephalic.     Comments: There is a 2.5 cm laceration to the scalp just above the hairline.  Bleeding is controlled. Eyes:     Extraocular Movements: Extraocular movements intact.     Pupils: Pupils are equal, round, and reactive to light.  Neck:  Comments: There is no cervical spine tenderness or step-off.  She has painless range of motion in all directions. Cardiovascular:     Rate and Rhythm: Normal rate and regular rhythm.     Heart sounds: No murmur heard.    No friction rub. No gallop.  Pulmonary:     Effort: Pulmonary effort is normal. No respiratory distress.     Breath sounds: Normal breath sounds. No wheezing.  Abdominal:     General: Bowel sounds are normal. There is no distension.     Palpations: Abdomen is soft.     Tenderness: There is no abdominal tenderness.  Musculoskeletal:        General: Normal range of motion.     Cervical back: Normal range of motion and neck supple.  Skin:    General: Skin is warm and dry.  Neurological:     General: No focal deficit present.     Mental Status: She is alert and oriented to  person, place, and time.     Cranial Nerves: No cranial nerve deficit.     Sensory: No sensory deficit.     Motor: No weakness.     ED Results / Procedures / Treatments   Labs (all labs ordered are listed, but only abnormal results are displayed) Labs Reviewed - No data to display  EKG None  Radiology DG Wrist Complete Left  Result Date: 09/17/2021 CLINICAL DATA:  left wrist pain post fall EXAM: LEFT WRIST - COMPLETE 3+ VIEW COMPARISON:  None Available. FINDINGS: There is no evidence of fracture or dislocation. Severe degenerative changes of the first carpometacarpal joint. Soft tissues are unremarkable. IMPRESSION: No acute displaced fracture or dislocation. Electronically Signed   By: Iven Finn M.D.   On: 09/17/2021 00:37   CT Head Wo Contrast  Result Date: 09/17/2021 CLINICAL DATA:  Head trauma, GCS=15, no focal neuro findings (low risk) (Ped 0-17y); Neck pain, acute, no red flags EXAM: CT HEAD WITHOUT CONTRAST CT CERVICAL SPINE WITHOUT CONTRAST TECHNIQUE: Multidetector CT imaging of the head and cervical spine was performed following the standard protocol without intravenous contrast. Multiplanar CT image reconstructions of the cervical spine were also generated. RADIATION DOSE REDUCTION: This exam was performed according to the departmental dose-optimization program which includes automated exposure control, adjustment of the mA and/or kV according to patient size and/or use of iterative reconstruction technique. COMPARISON:  CT cervical spine 11/16/2020, CT head 09/27/2020 FINDINGS: CT HEAD FINDINGS BRAIN: BRAIN Patchy and confluent areas of decreased attenuation are noted throughout the deep and periventricular white matter of the cerebral hemispheres bilaterally, compatible with chronic microvascular ischemic disease. No evidence of large-territorial acute infarction. No parenchymal hemorrhage. No mass lesion. No extra-axial collection. No mass effect or midline shift. No  hydrocephalus. Basilar cisterns are patent. Vascular: No hyperdense vessel. Skull: No acute fracture or focal lesion. Sinuses/Orbits: Paranasal sinuses and mastoid air cells are clear. Bilateral lens replacement. Otherwise the orbits are unremarkable. Other: None. CT CERVICAL SPINE FINDINGS Alignment: Normal. Skull base and vertebrae: Chronic stable anterior wedge compression deformity of the C5 vertebral body. Multilevel mild-to-moderate degenerative changes of the spine. No acute fracture. No aggressive appearing focal osseous lesion or focal pathologic process. Soft tissues and spinal canal: No prevertebral fluid or swelling. No visible canal hematoma. Upper chest: Biapical pleural/pulmonary scarring. Other: None. IMPRESSION: 1. No acute intracranial abnormality. 2. No acute displaced fracture or traumatic listhesis of the cervical spine. Electronically Signed   By: Iven Finn M.D.   On: 09/17/2021  00:36   CT Cervical Spine Wo Contrast  Result Date: 09/17/2021 CLINICAL DATA:  Head trauma, GCS=15, no focal neuro findings (low risk) (Ped 0-17y); Neck pain, acute, no red flags EXAM: CT HEAD WITHOUT CONTRAST CT CERVICAL SPINE WITHOUT CONTRAST TECHNIQUE: Multidetector CT imaging of the head and cervical spine was performed following the standard protocol without intravenous contrast. Multiplanar CT image reconstructions of the cervical spine were also generated. RADIATION DOSE REDUCTION: This exam was performed according to the departmental dose-optimization program which includes automated exposure control, adjustment of the mA and/or kV according to patient size and/or use of iterative reconstruction technique. COMPARISON:  CT cervical spine 11/16/2020, CT head 09/27/2020 FINDINGS: CT HEAD FINDINGS BRAIN: BRAIN Patchy and confluent areas of decreased attenuation are noted throughout the deep and periventricular white matter of the cerebral hemispheres bilaterally, compatible with chronic microvascular  ischemic disease. No evidence of large-territorial acute infarction. No parenchymal hemorrhage. No mass lesion. No extra-axial collection. No mass effect or midline shift. No hydrocephalus. Basilar cisterns are patent. Vascular: No hyperdense vessel. Skull: No acute fracture or focal lesion. Sinuses/Orbits: Paranasal sinuses and mastoid air cells are clear. Bilateral lens replacement. Otherwise the orbits are unremarkable. Other: None. CT CERVICAL SPINE FINDINGS Alignment: Normal. Skull base and vertebrae: Chronic stable anterior wedge compression deformity of the C5 vertebral body. Multilevel mild-to-moderate degenerative changes of the spine. No acute fracture. No aggressive appearing focal osseous lesion or focal pathologic process. Soft tissues and spinal canal: No prevertebral fluid or swelling. No visible canal hematoma. Upper chest: Biapical pleural/pulmonary scarring. Other: None. IMPRESSION: 1. No acute intracranial abnormality. 2. No acute displaced fracture or traumatic listhesis of the cervical spine. Electronically Signed   By: Iven Finn M.D.   On: 09/17/2021 00:36    Procedures Procedures    Medications Ordered in ED Medications  lidocaine (PF) (XYLOCAINE) 1 % injection 5 mL (has no administration in time range)    ED Course/ Medical Decision Making/ A&P  Patient is an 88 year old female with past medical history of hypertension.  Patient presenting today after a fall.  She struck her head on the wall trim and caused a laceration to the scalp.  There is no loss of consciousness and she is neurologically intact.  CT scan of the head is negative for acute injury.  CT cervical spine is also negative and x-rays of the left wrist and knee show no fracture.  Patient's laceration was repaired as below.  She seems appropriate for discharge with staple removal in 5 to 6 days.  LACERATION REPAIR Performed by: Veryl Speak Authorized by: Veryl Speak Consent: Verbal consent  obtained. Risks and benefits: risks, benefits and alternatives were discussed Consent given by: patient Patient identity confirmed: provided demographic data Prepped and Draped in normal sterile fashion Wound explored  Laceration Location: Scalp  Laceration Length: 2.5 cm  No Foreign Bodies seen or palpated  Anesthesia: local infiltration  Local anesthetic: lidocaine 1% without epinephrine  Anesthetic total: 4 ml  Irrigation method: syringe Amount of cleaning: standard  Skin closure: Staples  Number of sutures: 6  Technique: Staples  Patient tolerance: Patient tolerated the procedure well with no immediate complications.   Final Clinical Impression(s) / ED Diagnoses Final diagnoses:  None    Rx / DC Orders ED Discharge Orders     None         Veryl Speak, MD 09/17/21 [phone]

## 2021-09-17 NOTE — Discharge Instructions (Signed)
Continue medications as previously prescribed.  Local wound care with bacitracin twice daily.  Staples are to be removed in the next 5 to 7 days.  Please follow-up with your primary doctor for this.  Return to the emergency department if you develop severe headache, seizures/convulsions, confusion, difficulty waking, or for other new and concerning symptoms.

## 2021-09-21 ENCOUNTER — Other Ambulatory Visit: Payer: Self-pay

## 2021-09-21 ENCOUNTER — Emergency Department (HOSPITAL_BASED_OUTPATIENT_CLINIC_OR_DEPARTMENT_OTHER): Payer: Medicare Other

## 2021-09-21 ENCOUNTER — Encounter (HOSPITAL_BASED_OUTPATIENT_CLINIC_OR_DEPARTMENT_OTHER): Payer: Self-pay | Admitting: Emergency Medicine

## 2021-09-21 ENCOUNTER — Observation Stay (HOSPITAL_BASED_OUTPATIENT_CLINIC_OR_DEPARTMENT_OTHER)
Admission: EM | Admit: 2021-09-21 | Discharge: 2021-09-26 | Disposition: A | Discharge status: Skilled Nursing Facility | Payer: Medicare Other | Attending: Internal Medicine | Admitting: Internal Medicine

## 2021-09-21 DIAGNOSIS — I639 Cerebral infarction, unspecified: Secondary | ICD-10-CM

## 2021-09-21 DIAGNOSIS — R42 Dizziness and giddiness: Secondary | ICD-10-CM | POA: Diagnosis not present

## 2021-09-21 DIAGNOSIS — G9340 Encephalopathy, unspecified: Secondary | ICD-10-CM | POA: Diagnosis present

## 2021-09-21 DIAGNOSIS — Z7409 Other reduced mobility: Secondary | ICD-10-CM | POA: Insufficient documentation

## 2021-09-21 DIAGNOSIS — R262 Difficulty in walking, not elsewhere classified: Secondary | ICD-10-CM | POA: Insufficient documentation

## 2021-09-21 DIAGNOSIS — S21119A Laceration without foreign body of unspecified front wall of thorax without penetration into thoracic cavity, initial encounter: Secondary | ICD-10-CM | POA: Diagnosis not present

## 2021-09-21 DIAGNOSIS — I6529 Occlusion and stenosis of unspecified carotid artery: Secondary | ICD-10-CM

## 2021-09-21 DIAGNOSIS — I10 Essential (primary) hypertension: Secondary | ICD-10-CM | POA: Diagnosis present

## 2021-09-21 DIAGNOSIS — Z7982 Long term (current) use of aspirin: Secondary | ICD-10-CM | POA: Insufficient documentation

## 2021-09-21 DIAGNOSIS — G9341 Metabolic encephalopathy: Secondary | ICD-10-CM | POA: Diagnosis not present

## 2021-09-21 DIAGNOSIS — Z79899 Other long term (current) drug therapy: Secondary | ICD-10-CM | POA: Diagnosis not present

## 2021-09-21 DIAGNOSIS — R519 Headache, unspecified: Secondary | ICD-10-CM | POA: Diagnosis not present

## 2021-09-21 DIAGNOSIS — R2681 Unsteadiness on feet: Secondary | ICD-10-CM | POA: Diagnosis not present

## 2021-09-21 DIAGNOSIS — E538 Deficiency of other specified B group vitamins: Secondary | ICD-10-CM | POA: Insufficient documentation

## 2021-09-21 DIAGNOSIS — Z85828 Personal history of other malignant neoplasm of skin: Secondary | ICD-10-CM | POA: Diagnosis not present

## 2021-09-21 DIAGNOSIS — R410 Disorientation, unspecified: Secondary | ICD-10-CM | POA: Diagnosis not present

## 2021-09-21 DIAGNOSIS — E785 Hyperlipidemia, unspecified: Secondary | ICD-10-CM

## 2021-09-21 DIAGNOSIS — N390 Urinary tract infection, site not specified: Secondary | ICD-10-CM

## 2021-09-21 DIAGNOSIS — M4312 Spondylolisthesis, cervical region: Secondary | ICD-10-CM | POA: Diagnosis not present

## 2021-09-21 DIAGNOSIS — M545 Low back pain, unspecified: Secondary | ICD-10-CM | POA: Diagnosis not present

## 2021-09-21 DIAGNOSIS — E876 Hypokalemia: Secondary | ICD-10-CM | POA: Diagnosis not present

## 2021-09-21 DIAGNOSIS — R4182 Altered mental status, unspecified: Secondary | ICD-10-CM | POA: Diagnosis not present

## 2021-09-21 DIAGNOSIS — W19XXXA Unspecified fall, initial encounter: Secondary | ICD-10-CM

## 2021-09-21 DIAGNOSIS — M5136 Other intervertebral disc degeneration, lumbar region: Secondary | ICD-10-CM | POA: Diagnosis not present

## 2021-09-21 LAB — CBC WITH DIFFERENTIAL/PLATELET
Abs Immature Granulocytes: 0.03 10*3/uL (ref 0.00–0.07)
Basophils Absolute: 0 10*3/uL (ref 0.0–0.1)
Basophils Relative: 1 %
Eosinophils Absolute: 0.2 10*3/uL (ref 0.0–0.5)
Eosinophils Relative: 3 %
HCT: 40.9 % (ref 36.0–46.0)
Hemoglobin: 14.2 g/dL (ref 12.0–15.0)
Immature Granulocytes: 1 %
Lymphocytes Relative: 23 %
Lymphs Abs: 1.4 10*3/uL (ref 0.7–4.0)
MCH: 32.4 pg (ref 26.0–34.0)
MCHC: 34.7 g/dL (ref 30.0–36.0)
MCV: 93.4 fL (ref 80.0–100.0)
Monocytes Absolute: 0.6 10*3/uL (ref 0.1–1.0)
Monocytes Relative: 10 %
Neutro Abs: 3.9 10*3/uL (ref 1.7–7.7)
Neutrophils Relative %: 62 %
Platelets: 200 10*3/uL (ref 150–400)
RBC: 4.38 MIL/uL (ref 3.87–5.11)
RDW: 13.6 % (ref 11.5–15.5)
WBC: 6.1 10*3/uL (ref 4.0–10.5)
nRBC: 0 % (ref 0.0–0.2)

## 2021-09-21 LAB — URINALYSIS, ROUTINE W REFLEX MICROSCOPIC
Glucose, UA: NEGATIVE mg/dL
Hgb urine dipstick: NEGATIVE
Ketones, ur: 40 mg/dL — AB
Leukocytes,Ua: NEGATIVE
Nitrite: NEGATIVE
Protein, ur: 30 mg/dL — AB
Specific Gravity, Urine: 1.025 (ref 1.005–1.030)
pH: 6 (ref 5.0–8.0)

## 2021-09-21 LAB — URINALYSIS, MICROSCOPIC (REFLEX)

## 2021-09-21 LAB — BASIC METABOLIC PANEL
Anion gap: 8 (ref 5–15)
BUN: 13 mg/dL (ref 8–23)
CO2: 25 mmol/L (ref 22–32)
Calcium: 8.4 mg/dL — ABNORMAL LOW (ref 8.9–10.3)
Chloride: 104 mmol/L (ref 98–111)
Creatinine, Ser: 0.88 mg/dL (ref 0.44–1.00)
GFR, Estimated: >60 mL/min (ref >60)
Glucose, Bld: 94 mg/dL (ref 70–99)
Potassium: 3.7 mmol/L (ref 3.5–5.1)
Sodium: 137 mmol/L (ref 135–145)

## 2021-09-21 NOTE — ED Triage Notes (Signed)
Pt seen here 9/2 for fall and laceration to head. Pt has had worsening dizziness, additional fall last night with lower back pain and some confusion per family.

## 2021-09-22 ENCOUNTER — Encounter (HOSPITAL_BASED_OUTPATIENT_CLINIC_OR_DEPARTMENT_OTHER): Payer: Self-pay | Admitting: Emergency Medicine

## 2021-09-22 ENCOUNTER — Observation Stay (HOSPITAL_COMMUNITY): Payer: Medicare Other

## 2021-09-22 ENCOUNTER — Observation Stay (HOSPITAL_BASED_OUTPATIENT_CLINIC_OR_DEPARTMENT_OTHER): Payer: Medicare Other

## 2021-09-22 ENCOUNTER — Encounter (HOSPITAL_COMMUNITY): Payer: Self-pay

## 2021-09-22 DIAGNOSIS — E538 Deficiency of other specified B group vitamins: Secondary | ICD-10-CM | POA: Diagnosis not present

## 2021-09-22 DIAGNOSIS — I6601 Occlusion and stenosis of right middle cerebral artery: Secondary | ICD-10-CM | POA: Diagnosis not present

## 2021-09-22 DIAGNOSIS — Z79899 Other long term (current) drug therapy: Secondary | ICD-10-CM | POA: Diagnosis not present

## 2021-09-22 DIAGNOSIS — G9341 Metabolic encephalopathy: Secondary | ICD-10-CM | POA: Diagnosis not present

## 2021-09-22 DIAGNOSIS — N390 Urinary tract infection, site not specified: Secondary | ICD-10-CM | POA: Diagnosis not present

## 2021-09-22 DIAGNOSIS — G9340 Encephalopathy, unspecified: Secondary | ICD-10-CM

## 2021-09-22 DIAGNOSIS — R410 Disorientation, unspecified: Secondary | ICD-10-CM | POA: Diagnosis present

## 2021-09-22 DIAGNOSIS — Z7982 Long term (current) use of aspirin: Secondary | ICD-10-CM | POA: Diagnosis not present

## 2021-09-22 DIAGNOSIS — I6389 Other cerebral infarction: Secondary | ICD-10-CM

## 2021-09-22 DIAGNOSIS — E876 Hypokalemia: Secondary | ICD-10-CM | POA: Diagnosis not present

## 2021-09-22 DIAGNOSIS — I672 Cerebral atherosclerosis: Secondary | ICD-10-CM | POA: Diagnosis not present

## 2021-09-22 DIAGNOSIS — I10 Essential (primary) hypertension: Secondary | ICD-10-CM | POA: Diagnosis not present

## 2021-09-22 DIAGNOSIS — G319 Degenerative disease of nervous system, unspecified: Secondary | ICD-10-CM | POA: Diagnosis not present

## 2021-09-22 DIAGNOSIS — I6621 Occlusion and stenosis of right posterior cerebral artery: Secondary | ICD-10-CM | POA: Diagnosis not present

## 2021-09-22 DIAGNOSIS — I63233 Cerebral infarction due to unspecified occlusion or stenosis of bilateral carotid arteries: Secondary | ICD-10-CM | POA: Diagnosis not present

## 2021-09-22 DIAGNOSIS — Z85828 Personal history of other malignant neoplasm of skin: Secondary | ICD-10-CM | POA: Diagnosis not present

## 2021-09-22 LAB — HEPATIC FUNCTION PANEL
ALT: 10 U/L (ref 0–44)
AST: 17 U/L (ref 15–41)
Albumin: 3.4 g/dL — ABNORMAL LOW (ref 3.5–5.0)
Alkaline Phosphatase: 72 U/L (ref 38–126)
Bilirubin, Direct: 0.1 mg/dL (ref 0.0–0.2)
Indirect Bilirubin: 0.7 mg/dL (ref 0.3–0.9)
Total Bilirubin: 0.8 mg/dL (ref 0.3–1.2)
Total Protein: 6.4 g/dL — ABNORMAL LOW (ref 6.5–8.1)

## 2021-09-22 LAB — PHOSPHORUS: Phosphorus: 1.3 mg/dL — ABNORMAL LOW (ref 2.5–4.6)

## 2021-09-22 LAB — ECHOCARDIOGRAM COMPLETE
AR max vel: 1.82 cm2
AV Area VTI: 1.96 cm2
AV Area mean vel: 1.75 cm2
AV Mean grad: 5 mmHg
AV Peak grad: 9.7 mmHg
Ao pk vel: 1.56 m/s
Area-P 1/2: 3.72 cm2
Calc EF: 58.8 %
Height: 63 in
MV M vel: 2.32 m/s
MV Peak grad: 21.5 mmHg
S' Lateral: 2.7 cm
Single Plane A2C EF: 59.2 %
Single Plane A4C EF: 59.3 %
Weight: 1940.05 oz

## 2021-09-22 LAB — CBC
HCT: 39.9 % (ref 36.0–46.0)
Hemoglobin: 13.7 g/dL (ref 12.0–15.0)
MCH: 32.7 pg (ref 26.0–34.0)
MCHC: 34.3 g/dL (ref 30.0–36.0)
MCV: 95.2 fL (ref 80.0–100.0)
Platelets: 187 10*3/uL (ref 150–400)
RBC: 4.19 MIL/uL (ref 3.87–5.11)
RDW: 13.2 % (ref 11.5–15.5)
WBC: 5.2 10*3/uL (ref 4.0–10.5)
nRBC: 0 % (ref 0.0–0.2)

## 2021-09-22 LAB — LIPID PANEL
Cholesterol: 206 mg/dL — ABNORMAL HIGH (ref 0–200)
HDL: 30 mg/dL — ABNORMAL LOW (ref >40)
LDL Cholesterol: 155 mg/dL — ABNORMAL HIGH (ref 0–99)
Total CHOL/HDL Ratio: 6.9 RATIO
Triglycerides: 106 mg/dL (ref <150)
VLDL: 21 mg/dL (ref 0–40)

## 2021-09-22 LAB — RAPID HIV SCREEN (HIV 1/2 AB+AG)
HIV 1/2 Antibodies: NONREACTIVE
HIV-1 P24 Antigen - HIV24: NONREACTIVE

## 2021-09-22 LAB — BASIC METABOLIC PANEL
Anion gap: 9 (ref 5–15)
BUN: 12 mg/dL (ref 8–23)
CO2: 25 mmol/L (ref 22–32)
Calcium: 8.3 mg/dL — ABNORMAL LOW (ref 8.9–10.3)
Chloride: 105 mmol/L (ref 98–111)
Creatinine, Ser: 0.74 mg/dL (ref 0.44–1.00)
GFR, Estimated: >60 mL/min (ref >60)
Glucose, Bld: 88 mg/dL (ref 70–99)
Potassium: 3.4 mmol/L — ABNORMAL LOW (ref 3.5–5.1)
Sodium: 139 mmol/L (ref 135–145)

## 2021-09-22 LAB — VITAMIN B12: Vitamin B-12: 69 pg/mL — ABNORMAL LOW (ref 180–914)

## 2021-09-22 LAB — MAGNESIUM: Magnesium: 2 mg/dL (ref 1.7–2.4)

## 2021-09-22 LAB — RPR: RPR Ser Ql: NONREACTIVE

## 2021-09-22 LAB — TSH: TSH: 3.249 u[IU]/mL (ref 0.350–4.500)

## 2021-09-22 LAB — HIV ANTIBODY (ROUTINE TESTING W REFLEX): HIV Screen 4th Generation wRfx: NONREACTIVE

## 2021-09-22 LAB — HEMOGLOBIN A1C
Hgb A1c MFr Bld: 5 % (ref 4.8–5.6)
Mean Plasma Glucose: 96.8 mg/dL

## 2021-09-22 LAB — AMMONIA: Ammonia: 25 umol/L (ref 9–35)

## 2021-09-22 MED ORDER — SODIUM CHLORIDE 0.9% FLUSH
3.0000 mL | INTRAVENOUS | Status: DC | PRN
  Administered 2021-09-25: 3 mL via INTRAVENOUS

## 2021-09-22 MED ORDER — SODIUM CHLORIDE 0.9 % IV SOLN: INTRAVENOUS | Status: DC

## 2021-09-22 MED ORDER — SODIUM CHLORIDE 0.9 % IV SOLN
1.0000 g | Freq: Once | INTRAVENOUS | Status: AC
  Administered 2021-09-22: 1 g via INTRAVENOUS
  Filled 2021-09-22: qty 10

## 2021-09-22 MED ORDER — ACETAMINOPHEN 325 MG PO TABS: 650.0000 mg | ORAL_TABLET | Freq: Four times a day (QID) | ORAL | Status: DC | PRN

## 2021-09-22 MED ORDER — SODIUM CHLORIDE 0.9% FLUSH
3.0000 mL | Freq: Two times a day (BID) | INTRAVENOUS | Status: DC
  Administered 2021-09-22 – 2021-09-25 (×3): 3 mL via INTRAVENOUS

## 2021-09-22 MED ORDER — ACETAMINOPHEN 650 MG RE SUPP: 650.0000 mg | Freq: Four times a day (QID) | RECTAL | Status: DC | PRN

## 2021-09-22 MED ORDER — K PHOS MONO-SOD PHOS DI & MONO 155-852-130 MG PO TABS
250.0000 mg | ORAL_TABLET | Freq: Three times a day (TID) | ORAL | Status: AC
Start: 2021-09-22 — End: 2021-09-23
  Administered 2021-09-22 – 2021-09-23 (×6): 250 mg via ORAL
  Filled 2021-09-22 (×6): qty 1

## 2021-09-22 MED ORDER — LORAZEPAM 2 MG/ML IJ SOLN
1.0000 mg | Freq: Once | INTRAMUSCULAR | Status: AC | PRN
  Administered 2021-09-22: 1 mg via INTRAVENOUS
  Filled 2021-09-22: qty 1

## 2021-09-22 MED ORDER — CLOPIDOGREL BISULFATE 75 MG PO TABS
75.0000 mg | ORAL_TABLET | Freq: Every day | ORAL | Status: DC
  Administered 2021-09-22: 75 mg via ORAL
  Filled 2021-09-22: qty 1

## 2021-09-22 MED ORDER — ENOXAPARIN SODIUM 40 MG/0.4ML IJ SOSY
40.0000 mg | PREFILLED_SYRINGE | INTRAMUSCULAR | Status: DC
  Administered 2021-09-22 – 2021-09-26 (×5): 40 mg via SUBCUTANEOUS
  Filled 2021-09-22 (×5): qty 0.4

## 2021-09-22 MED ORDER — IOHEXOL 350 MG/ML SOLN
75.0000 mL | Freq: Once | INTRAVENOUS | Status: AC | PRN
  Administered 2021-09-22: 75 mL via INTRAVENOUS

## 2021-09-22 MED ORDER — POTASSIUM CHLORIDE CRYS ER 20 MEQ PO TBCR
40.0000 meq | EXTENDED_RELEASE_TABLET | Freq: Once | ORAL | Status: AC
  Administered 2021-09-22: 40 meq via ORAL
  Filled 2021-09-22: qty 2

## 2021-09-22 MED ORDER — SODIUM CHLORIDE 0.9% FLUSH
3.0000 mL | Freq: Two times a day (BID) | INTRAVENOUS | Status: DC
  Administered 2021-09-22 – 2021-09-26 (×8): 3 mL via INTRAVENOUS

## 2021-09-22 MED ORDER — CYANOCOBALAMIN 1000 MCG/ML IJ SOLN
1000.0000 ug | Freq: Every day | INTRAMUSCULAR | Status: AC
Start: 2021-09-22 — End: 2021-09-26
  Administered 2021-09-22 – 2021-09-26 (×5): 1000 ug via INTRAMUSCULAR
  Filled 2021-09-22 (×5): qty 1

## 2021-09-22 MED ORDER — ATORVASTATIN CALCIUM 40 MG PO TABS
80.0000 mg | ORAL_TABLET | Freq: Every day | ORAL | Status: DC
  Administered 2021-09-22 – 2021-09-26 (×5): 80 mg via ORAL
  Filled 2021-09-22 (×5): qty 2

## 2021-09-22 MED ORDER — HYDRALAZINE HCL 25 MG PO TABS: 25.0000 mg | ORAL_TABLET | Freq: Four times a day (QID) | ORAL | Status: DC | PRN

## 2021-09-22 MED ORDER — ASPIRIN 81 MG PO TBEC
81.0000 mg | DELAYED_RELEASE_TABLET | Freq: Every day | ORAL | Status: DC
  Administered 2021-09-22 – 2021-09-26 (×5): 81 mg via ORAL
  Filled 2021-09-22 (×6): qty 1

## 2021-09-22 MED ORDER — SODIUM CHLORIDE (PF) 0.9 % IJ SOLN
INTRAMUSCULAR | Status: AC
  Administered 2021-09-23: 3 mL via INTRAVENOUS
  Filled 2021-09-22: qty 50

## 2021-09-22 MED ORDER — SODIUM CHLORIDE 0.9 % IV SOLN: 250.0000 mL | INTRAVENOUS | Status: DC | PRN

## 2021-09-22 MED ORDER — SENNOSIDES-DOCUSATE SODIUM 8.6-50 MG PO TABS: 1.0000 | ORAL_TABLET | Freq: Every evening | ORAL | Status: DC | PRN

## 2021-09-22 NOTE — Progress Notes (Signed)
  Transition of Care [facility]) Screening Note   Patient Details  Name: Cheyenne Andrews Date of Birth: [birth year]   Transition of Care [facility]) CM/SW Contact:    Roseanne Kaufman, RN Phone Number: 09/22/2021, 3:52 PM    Transition of Care Department [facility]) has reviewed patient and no TOC needs have been identified at this time. We will continue to monitor patient advancement through interdisciplinary progression rounds. If new patient transition needs arise, please place a TOC consult.

## 2021-09-22 NOTE — ED Provider Notes (Signed)
[facility] Provider Note   CSN: [id number] Arrival date & time: 09/21/21  1931     History  Chief Complaint  Patient presents with   Lytle Michaels    Cheyenne Andrews is a 88 y.o. female.  The history is provided by the patient.  Fall This is a recurrent problem. The current episode started 6 to 12 hours ago. The problem occurs constantly. The problem has been resolved. Pertinent negatives include no chest pain, no headaches and no shortness of breath. Nothing aggravates the symptoms. Nothing relieves the symptoms. She has tried nothing for the symptoms. The treatment provided no relief.  Patient with a h/o falls most recently seen for same 4 days ago presents with another fall and confusion and global weakness.  No fever, no vomiting.  No chest pain.      Past Medical History:  Diagnosis Date   C2 cervical fracture ([place])     Home Medications Prior to Admission medications   Medication Sig Start Date End Date Taking? Authorizing Provider  acetaminophen (TYLENOL) 500 MG tablet Take 1 tablet (500 mg total) by mouth every 6 (six) hours as needed for moderate pain (or Fever >/= 101). 03/26/14   Barton Dubois, MD  aspirin EC 81 MG tablet Take 1 tablet (81 mg total) by mouth daily. 03/26/14   Barton Dubois, MD  Cholecalciferol (VITAMIN D-3) 1000 UNITS CAPS Take 1 capsule by mouth 2 (two) times daily.    [provider]  FLUoxetine (PROZAC) 10 MG capsule Take 10 mg by mouth daily.    [provider]  HYDROcodone-acetaminophen (NORCO/VICODIN) 5-325 MG tablet Take 0.5 tablets by mouth every 6 (six) hours as needed. 09/27/20   Caccavale, Sophia, PA-C  Hypromellose (GENTEAL MILD OP) Apply 1-2 drops to eye 3 (three) times daily as needed (for eye dryness).    [provider]  ibandronate (BONIVA) 150 MG tablet Take 150 mg by mouth every 30 (thirty) days. Take in the morning with a full glass of water, on an empty stomach, and do not take  anything else by mouth or lie down for the next 30 min.    [provider]  methocarbamol (ROBAXIN) 500 MG tablet Take 1 tablet (500 mg total) by mouth 2 (two) times daily as needed for muscle spasms. 09/27/20   Caccavale, Sophia, PA-C  Multiple Vitamin (MULTIVITAMIN) tablet Take 1 tablet by mouth daily.    [provider]      Allergies    Patient has no known allergies.    Review of Systems   Review of Systems  Unable to perform ROS: Mental status change  Constitutional:  Positive for fatigue. Negative for fever.  HENT:  Negative for facial swelling.   Eyes:  Negative for redness.  Respiratory:  Negative for shortness of breath.   Cardiovascular:  Negative for chest pain.  Musculoskeletal:  Positive for arthralgias.  Skin:  Negative for rash.  Neurological:  Negative for headaches.  Psychiatric/Behavioral:  Positive for confusion.     Physical Exam Updated Vital Signs BP 138/69   Pulse 60   Temp 97.9 F (36.6 C) (Oral)   Resp 18   Ht '5\' 3"'$  (1.6 m)   Wt 55 kg   SpO2 98%   BMI 21.48 kg/m  Physical Exam Vitals and nursing note reviewed. Exam conducted with a chaperone present.  Constitutional:      General: She is not in acute distress.    Appearance: She is well-developed.  HENT:  Head: Normocephalic and atraumatic.     Nose: Nose normal.  Eyes:     Pupils: Pupils are equal, round, and reactive to light.  Cardiovascular:     Rate and Rhythm: Normal rate and regular rhythm.     Pulses: Normal pulses.     Heart sounds: Normal heart sounds.  Pulmonary:     Effort: Pulmonary effort is normal. No respiratory distress.     Breath sounds: Normal breath sounds.  Abdominal:     General: Abdomen is flat. Bowel sounds are normal. There is no distension.     Palpations: Abdomen is soft.     Tenderness: There is no abdominal tenderness. There is no guarding or rebound.  Genitourinary:    Vagina: No vaginal discharge.  Musculoskeletal:        General:  Normal range of motion.     Cervical back: Normal range of motion and neck supple.  Skin:    General: Skin is warm and dry.     Capillary Refill: Capillary refill takes less than 2 seconds.     Findings: No erythema or rash.  Neurological:     General: No focal deficit present.     Deep Tendon Reflexes: Reflexes normal.  Psychiatric:        Mood and Affect: Mood normal.     ED Results / Procedures / Treatments   Labs (all labs ordered are listed, but only abnormal results are displayed) Results for orders placed or performed during the hospital encounter of 09/21/21  CBC with Differential  Result Value Ref Range   WBC 6.1 4.0 - 10.5 K/uL   RBC 4.38 3.87 - 5.11 MIL/uL   Hemoglobin 14.2 12.0 - 15.0 g/dL   HCT 40.9 36.0 - 46.0 %   MCV 93.4 80.0 - 100.0 fL   MCH 32.4 26.0 - 34.0 pg   MCHC 34.7 30.0 - 36.0 g/dL   RDW 13.6 11.5 - 15.5 %   Platelets 200 150 - 400 K/uL   nRBC 0.0 0.0 - 0.2 %   Neutrophils Relative % 62 %   Neutro Abs 3.9 1.7 - 7.7 K/uL   Lymphocytes Relative 23 %   Lymphs Abs 1.4 0.7 - 4.0 K/uL   Monocytes Relative 10 %   Monocytes Absolute 0.6 0.1 - 1.0 K/uL   Eosinophils Relative 3 %   Eosinophils Absolute 0.2 0.0 - 0.5 K/uL   Basophils Relative 1 %   Basophils Absolute 0.0 0.0 - 0.1 K/uL   Immature Granulocytes 1 %   Abs Immature Granulocytes 0.03 0.00 - 0.07 K/uL  Basic metabolic panel  Result Value Ref Range   Sodium 137 135 - 145 mmol/L   Potassium 3.7 3.5 - 5.1 mmol/L   Chloride 104 98 - 111 mmol/L   CO2 25 22 - 32 mmol/L   Glucose, Bld 94 70 - 99 mg/dL   BUN 13 8 - 23 mg/dL   Creatinine, Ser 0.88 0.44 - 1.00 mg/dL   Calcium 8.4 (L) 8.9 - 10.3 mg/dL   GFR, Estimated >60 >60 mL/min   Anion gap 8 5 - 15  Urinalysis, Routine w reflex microscopic Urine, Clean Catch  Result Value Ref Range   Color, Urine YELLOW YELLOW   APPearance CLOUDY (A) CLEAR   Specific Gravity, Urine 1.025 1.005 - 1.030   pH 6.0 5.0 - 8.0   Glucose, UA NEGATIVE NEGATIVE  mg/dL   Hgb urine dipstick NEGATIVE NEGATIVE   Bilirubin Urine SMALL (A) NEGATIVE   Ketones,  ur 40 (A) NEGATIVE mg/dL   Protein, ur 30 (A) NEGATIVE mg/dL   Nitrite NEGATIVE NEGATIVE   Leukocytes,Ua NEGATIVE NEGATIVE  Urinalysis, Microscopic (reflex)  Result Value Ref Range   RBC / HPF 0-5 0 - 5 RBC/hpf   WBC, UA 6-10 0 - 5 WBC/hpf   Bacteria, UA MANY (A) NONE SEEN   Squamous Epithelial / LPF 11-20 0 - 5   Mucus PRESENT    CT Cervical Spine Wo Contrast  Result Date: 09/22/2021 CLINICAL DATA:  Fall EXAM: CT CERVICAL SPINE WITHOUT CONTRAST TECHNIQUE: Multidetector CT imaging of the cervical spine was performed without intravenous contrast. Multiplanar CT image reconstructions were also generated. RADIATION DOSE REDUCTION: This exam was performed according to the departmental dose-optimization program which includes automated exposure control, adjustment of the mA and/or kV according to patient size and/or use of iterative reconstruction technique. COMPARISON:  09/17/2021 FINDINGS: Alignment: No traumatic listhesis. Unchanged trace anterolisthesis of C4 on C5, which appears facet mediated. Skull base and vertebrae: No acute fracture or suspicious osseous lesion. Soft tissues and spinal canal: Negative. Disc levels: Multilevel degenerative changes without high-grade spinal canal stenosis Upper chest: Apical pleural-parenchymal scarring. No focal pulmonary opacity or pleural effusion. Other: None. IMPRESSION: No acute fracture or traumatic listhesis in the cervical spine. Electronically Signed   By: Merilyn Baba M.D.   On: 09/22/2021 00:05   DG Chest Portable 1 View  Result Date: 09/21/2021 CLINICAL DATA:  Fall laceration EXAM: PORTABLE CHEST 1 VIEW COMPARISON:  09/27/2020 FINDINGS: No acute airspace disease or effusion. Normal cardiomediastinal silhouette with aortic atherosclerosis. No pneumothorax. IMPRESSION: No active disease. Electronically Signed   By: Donavan Foil M.D.   On: 09/21/2021 23:57    CT Head Wo Contrast  Result Date: 09/21/2021 CLINICAL DATA:  Head trauma, moderate-severe. Pt seen here 9/2 for fall and laceration to head. Pt has had worsening dizziness, additional fall last night with lower back pain and some confusion per family. EXAM: CT HEAD WITHOUT CONTRAST TECHNIQUE: Contiguous axial images were obtained from the base of the skull through the vertex without intravenous contrast. RADIATION DOSE REDUCTION: This exam was performed according to the departmental dose-optimization program which includes automated exposure control, adjustment of the mA and/or kV according to patient size and/or use of iterative reconstruction technique. COMPARISON:  CT head 09/17/2021 FINDINGS: Brain: No evidence of large-territorial acute infarction. No parenchymal hemorrhage. No mass lesion. No extra-axial collection. No mass effect or midline shift. No hydrocephalus. Basilar cisterns are patent. Vascular: No hyperdense vessel. Skull: No acute fracture or focal lesion. Sinuses/Orbits: Paranasal sinuses and mastoid air cells are clear. Bilateral lens replacement. Otherwise the orbits are unremarkable. Other: Right scalp skin staples. IMPRESSION: No acute intracranial abnormality. Electronically Signed   By: Iven Finn M.D.   On: 09/21/2021 20:38   DG Lumbar Spine Complete  Result Date: 09/21/2021 CLINICAL DATA:  Fall, low back pain EXAM: LUMBAR SPINE - COMPLETE 4+ VIEW COMPARISON:  None Available. FINDINGS: Five lumbar-type vertebral bodies. Normal lumbar lordosis.  Mild lumbar levoscoliosis. No evidence of fracture or dislocation. Vertebral body heights are maintained. Mild degenerative changes of the lower lumbar spine. Visualized bony pelvis appears intact. IMPRESSION: Negative. Electronically Signed   By: Julian Hy M.D.   On: 09/21/2021 20:35   DG Knee Complete 4 Views Left  Result Date: 09/17/2021 CLINICAL DATA:  left wrist pain post fall EXAM: LEFT KNEE - COMPLETE 4+ VIEW COMPARISON:   None Available. FINDINGS: No evidence of fracture, dislocation, or joint effusion.  Mild tricompartmental degenerative changes of the knee. No evidence of arthropathy or other focal bone abnormality. Soft tissues are unremarkable. IMPRESSION: No acute displaced fracture or dislocation. Electronically Signed   By: Iven Finn M.D.   On: 09/17/2021 00:44   DG Wrist Complete Left  Result Date: 09/17/2021 CLINICAL DATA:  left wrist pain post fall EXAM: LEFT WRIST - COMPLETE 3+ VIEW COMPARISON:  None Available. FINDINGS: There is no evidence of fracture or dislocation. Severe degenerative changes of the first carpometacarpal joint. Soft tissues are unremarkable. IMPRESSION: No acute displaced fracture or dislocation. Electronically Signed   By: Iven Finn M.D.   On: 09/17/2021 00:37   CT Head Wo Contrast  Result Date: 09/17/2021 CLINICAL DATA:  Head trauma, GCS=15, no focal neuro findings (low risk) (Ped 0-17y); Neck pain, acute, no red flags EXAM: CT HEAD WITHOUT CONTRAST CT CERVICAL SPINE WITHOUT CONTRAST TECHNIQUE: Multidetector CT imaging of the head and cervical spine was performed following the standard protocol without intravenous contrast. Multiplanar CT image reconstructions of the cervical spine were also generated. RADIATION DOSE REDUCTION: This exam was performed according to the departmental dose-optimization program which includes automated exposure control, adjustment of the mA and/or kV according to patient size and/or use of iterative reconstruction technique. COMPARISON:  CT cervical spine 11/16/2020, CT head 09/27/2020 FINDINGS: CT HEAD FINDINGS BRAIN: BRAIN Patchy and confluent areas of decreased attenuation are noted throughout the deep and periventricular white matter of the cerebral hemispheres bilaterally, compatible with chronic microvascular ischemic disease. No evidence of large-territorial acute infarction. No parenchymal hemorrhage. No mass lesion. No extra-axial collection. No  mass effect or midline shift. No hydrocephalus. Basilar cisterns are patent. Vascular: No hyperdense vessel. Skull: No acute fracture or focal lesion. Sinuses/Orbits: Paranasal sinuses and mastoid air cells are clear. Bilateral lens replacement. Otherwise the orbits are unremarkable. Other: None. CT CERVICAL SPINE FINDINGS Alignment: Normal. Skull base and vertebrae: Chronic stable anterior wedge compression deformity of the C5 vertebral body. Multilevel mild-to-moderate degenerative changes of the spine. No acute fracture. No aggressive appearing focal osseous lesion or focal pathologic process. Soft tissues and spinal canal: No prevertebral fluid or swelling. No visible canal hematoma. Upper chest: Biapical pleural/pulmonary scarring. Other: None. IMPRESSION: 1. No acute intracranial abnormality. 2. No acute displaced fracture or traumatic listhesis of the cervical spine. Electronically Signed   By: Iven Finn M.D.   On: 09/17/2021 00:36   CT Cervical Spine Wo Contrast  Result Date: 09/17/2021 CLINICAL DATA:  Head trauma, GCS=15, no focal neuro findings (low risk) (Ped 0-17y); Neck pain, acute, no red flags EXAM: CT HEAD WITHOUT CONTRAST CT CERVICAL SPINE WITHOUT CONTRAST TECHNIQUE: Multidetector CT imaging of the head and cervical spine was performed following the standard protocol without intravenous contrast. Multiplanar CT image reconstructions of the cervical spine were also generated. RADIATION DOSE REDUCTION: This exam was performed according to the departmental dose-optimization program which includes automated exposure control, adjustment of the mA and/or kV according to patient size and/or use of iterative reconstruction technique. COMPARISON:  CT cervical spine 11/16/2020, CT head 09/27/2020 FINDINGS: CT HEAD FINDINGS BRAIN: BRAIN Patchy and confluent areas of decreased attenuation are noted throughout the deep and periventricular white matter of the cerebral hemispheres bilaterally, compatible  with chronic microvascular ischemic disease. No evidence of large-territorial acute infarction. No parenchymal hemorrhage. No mass lesion. No extra-axial collection. No mass effect or midline shift. No hydrocephalus. Basilar cisterns are patent. Vascular: No hyperdense vessel. Skull: No acute fracture or focal lesion. Sinuses/Orbits: Paranasal sinuses and  mastoid air cells are clear. Bilateral lens replacement. Otherwise the orbits are unremarkable. Other: None. CT CERVICAL SPINE FINDINGS Alignment: Normal. Skull base and vertebrae: Chronic stable anterior wedge compression deformity of the C5 vertebral body. Multilevel mild-to-moderate degenerative changes of the spine. No acute fracture. No aggressive appearing focal osseous lesion or focal pathologic process. Soft tissues and spinal canal: No prevertebral fluid or swelling. No visible canal hematoma. Upper chest: Biapical pleural/pulmonary scarring. Other: None. IMPRESSION: 1. No acute intracranial abnormality. 2. No acute displaced fracture or traumatic listhesis of the cervical spine. Electronically Signed   By: Iven Finn M.D.   On: 09/17/2021 00:36     EKG None  Radiology CT Cervical Spine Wo Contrast  Result Date: 09/22/2021 CLINICAL DATA:  Fall EXAM: CT CERVICAL SPINE WITHOUT CONTRAST TECHNIQUE: Multidetector CT imaging of the cervical spine was performed without intravenous contrast. Multiplanar CT image reconstructions were also generated. RADIATION DOSE REDUCTION: This exam was performed according to the departmental dose-optimization program which includes automated exposure control, adjustment of the mA and/or kV according to patient size and/or use of iterative reconstruction technique. COMPARISON:  09/17/2021 FINDINGS: Alignment: No traumatic listhesis. Unchanged trace anterolisthesis of C4 on C5, which appears facet mediated. Skull base and vertebrae: No acute fracture or suspicious osseous lesion. Soft tissues and spinal canal:  Negative. Disc levels: Multilevel degenerative changes without high-grade spinal canal stenosis Upper chest: Apical pleural-parenchymal scarring. No focal pulmonary opacity or pleural effusion. Other: None. IMPRESSION: No acute fracture or traumatic listhesis in the cervical spine. Electronically Signed   By: Merilyn Baba M.D.   On: 09/22/2021 00:05   DG Chest Portable 1 View  Result Date: 09/21/2021 CLINICAL DATA:  Fall laceration EXAM: PORTABLE CHEST 1 VIEW COMPARISON:  09/27/2020 FINDINGS: No acute airspace disease or effusion. Normal cardiomediastinal silhouette with aortic atherosclerosis. No pneumothorax. IMPRESSION: No active disease. Electronically Signed   By: Donavan Foil M.D.   On: 09/21/2021 23:57   CT Head Wo Contrast  Result Date: 09/21/2021 CLINICAL DATA:  Head trauma, moderate-severe. Pt seen here 9/2 for fall and laceration to head. Pt has had worsening dizziness, additional fall last night with lower back pain and some confusion per family. EXAM: CT HEAD WITHOUT CONTRAST TECHNIQUE: Contiguous axial images were obtained from the base of the skull through the vertex without intravenous contrast. RADIATION DOSE REDUCTION: This exam was performed according to the departmental dose-optimization program which includes automated exposure control, adjustment of the mA and/or kV according to patient size and/or use of iterative reconstruction technique. COMPARISON:  CT head 09/17/2021 FINDINGS: Brain: No evidence of large-territorial acute infarction. No parenchymal hemorrhage. No mass lesion. No extra-axial collection. No mass effect or midline shift. No hydrocephalus. Basilar cisterns are patent. Vascular: No hyperdense vessel. Skull: No acute fracture or focal lesion. Sinuses/Orbits: Paranasal sinuses and mastoid air cells are clear. Bilateral lens replacement. Otherwise the orbits are unremarkable. Other: Right scalp skin staples. IMPRESSION: No acute intracranial abnormality. Electronically  Signed   By: Iven Finn M.D.   On: 09/21/2021 20:38   DG Lumbar Spine Complete  Result Date: 09/21/2021 CLINICAL DATA:  Fall, low back pain EXAM: LUMBAR SPINE - COMPLETE 4+ VIEW COMPARISON:  None Available. FINDINGS: Five lumbar-type vertebral bodies. Normal lumbar lordosis.  Mild lumbar levoscoliosis. No evidence of fracture or dislocation. Vertebral body heights are maintained. Mild degenerative changes of the lower lumbar spine. Visualized bony pelvis appears intact. IMPRESSION: Negative. Electronically Signed   By: Julian Hy M.D.   On: 09/21/2021  20:35    Procedures Procedures    Medications Ordered in ED Medications  0.9 %  sodium chloride infusion ( Intravenous New Bag/Given 09/22/21 0055)  cefTRIAXone (ROCEPHIN) 1 g in sodium chloride 0.9 % 100 mL IVPB (0 g Intravenous Stopped 09/22/21 0105)    ED Course/ Medical Decision Making/ A&P                           Medical Decision Making Patient with global weakness and confusion and frequent falls no using cane  Problems Addressed: Altered mental status, unspecified altered mental status type:    Details: Likely secondary to UTI treated with rocephin and admission  Amount and/or Complexity of Data Reviewed Independent Historian:     Details: Children see above  External Data Reviewed: radiology and notes.    Details: Previous notes reviewed. Previous CT reviewed Labs: ordered.    Details: All labs reviewed: urine is consistent with UTI.  White count normal 6.1 hemoglobin normal 14.2 normal platelet count 200k.  Normal sodium 137 and creatinine .88 normal potassium  Radiology: ordered and independent interpretation performed.    Details: Negative head and CT Cspine by me.  Noegative CXR by me   Risk Prescription drug management. Decision regarding hospitalization. Risk Details: The patient appears reasonably stabilized for admission considering the current resources, flow, and capabilities available in the ED at this  time, and I doubt any other [facility] requiring further screening and/or treatment in the ED prior to admission.     Final Clinical Impression(s) / ED Diagnoses Final diagnoses:  Fall, initial encounter  Urinary tract infection without hematuria, site unspecified  Altered mental status, unspecified altered mental status type    Rx / DC Orders ED Discharge Orders     None         Joslynn Jamroz, MD 09/22/21 3403

## 2021-09-22 NOTE — Evaluation (Signed)
Physical Therapy Evaluation Patient Details Name: Cheyenne Andrews MRN: [id number] DOB: Sep 13, 1933 Today's Date: 09/22/2021  History of Present Illness  88 yo female  who presented to the ED with dizziness, falls, and confusion  admitted with acute encephalopathy, multiple falls most recent resulting in  head lac. HWE:[id number], anxiety, and history of C2 fracture  MRI =punctate foci of mild DWI hyperintensity in left occipital  lobe could represent acute or subacute infarcts versus artifact. No  significant edema or mass effect.  Clinical Impression  Pt admitted with above diagnosis.  Pt cooperative however very confused, oriented to self and "they say I am in the hospital" only.  Requiring min to mod assist for mobility/basic tasks. Per family pt has been declining recently.  See below for deficits.  Recommend  SNF post acute, will follow.   Pt currently with functional limitations due to the deficits listed below (see PT Problem List). Pt will benefit from skilled PT to increase their independence and safety with mobility to allow discharge to the venue listed below.          Recommendations for follow up therapy are one component of a multi-disciplinary discharge planning process, led by the attending physician.  Recommendations may be updated based on patient status, additional functional criteria and insurance authorization.  Follow Up Recommendations Skilled nursing-short term rehab (<3 hours/day) Can patient physically be transported by private vehicle: No    Assistance Recommended at Discharge Intermittent Supervision/Assistance  Patient can return home with the following  A little help with walking and/or transfers;A little help with bathing/dressing/bathroom;Assistance with cooking/housework;Assist for transportation;Direct supervision/assist for financial management;Help with stairs or ramp for entrance;Direct supervision/assist for medications management    Equipment  Recommendations None recommended by PT  Recommendations for Other Services       Functional Status Assessment Patient has had a recent decline in their functional status and demonstrates the ability to make significant improvements in function in a reasonable and predictable amount of time.     Precautions / Restrictions Precautions Precautions: Fall Restrictions Weight Bearing Restrictions: No      Mobility  Bed Mobility Overal bed mobility: Needs Assistance Bed Mobility: Supine to Sit     Supine to sit: Min assist     General bed mobility comments: incr time, assist to elevate trunk    Transfers Overall transfer level: Needs assistance Equipment used: Rolling walker (2 wheels) Transfers: Sit to/from Stand Sit to Stand: Min assist, Mod assist           General transfer comment: multiple attempts to stand from varied ht surfaces, widens BOS to compensate. assist to rise and stabilize; multi-modal cues for hand placement    Ambulation/Gait Ambulation/Gait assistance: Min assist Gait Distance (Feet): 15 Feet (2) Assistive device: Rolling walker (2 wheels) Gait Pattern/deviations: Wide base of support, Decreased stride length, Step-through pattern, Drifts right/left       General Gait Details: assist to balance, maneuver RW around obstacles in room/bathroom. multi-modal cues for safety. LOB x2 while taking steps backward needing min assist to prevent fall  Stairs            Wheelchair Mobility    Modified Rankin (Stroke Patients Only)       Balance Overall balance assessment: Needs assistance Sitting-balance support: No upper extremity supported, Feet supported Sitting balance-Leahy Scale: Poor   Postural control: Posterior lean   Standing balance-Leahy Scale: Poor Standing balance comment: initial posterior LOB, reliant on UEs and external assist  High level balance activites: Backward walking, Turns High Level Balance Comments:  requires assist, LOB multiple times, delayed balance reactions             Pertinent Vitals/Pain Pain Assessment Pain Assessment: No/denies pain    Home Living Family/patient expects to be discharged to:: Private residence Living Arrangements: Alone Available Help at Discharge: Family;Available PRN/intermittently Type of Home: House Home Access: Stairs to enter   Entrance Stairs-Number of Steps: 2   Home Layout: One level Home Equipment: Cane - single point Additional Comments: per dtr pt has been declining recently, needing more assist; no longer drives, though pt says she will.  has 2 other dtrs however the majority of assist fall son dtr currently with pt in hospital and she cannot provide 24hour assist    Prior Function Prior Level of Function : Independent/Modified Independent             Mobility Comments: amb with cane       Hand Dominance        Extremity/Trunk Assessment   Upper Extremity Assessment Upper Extremity Assessment: Defer to OT evaluation    Lower Extremity Assessment Lower Extremity Assessment: Generalized weakness       Communication   Communication: No difficulties  Cognition Arousal/Alertness: Awake/alert Behavior During Therapy: WFL for tasks assessed/performed Overall Cognitive Status: Impaired/Different from baseline Area of Impairment: Orientation, Attention, Memory, Following commands, Safety/judgement, Awareness, Problem solving                 Orientation Level: Disoriented to, Time, Situation ("Feb, 1880") Current Attention Level: Focused Memory: Decreased short-term memory Following Commands: Follows one step commands with increased time, Follows multi-step commands inconsistently Safety/Judgement: Decreased awareness of safety, Decreased awareness of deficits Awareness:  (unable to state reason for being hospital, states this is a "trick") Problem Solving: Slow processing, Decreased initiation, Difficulty  sequencing, Requires verbal cues, Requires tactile cues General Comments: pt cooperative, paranoid about "being tricked"; very confused, makes multiple nonsensical or unrelated statements        General Comments      Exercises     Assessment/Plan    PT Assessment Patient needs continued PT services  PT Problem List Decreased mobility;Decreased activity tolerance;Decreased balance;Decreased knowledge of use of DME;Decreased coordination       PT Treatment Interventions DME instruction;Therapeutic exercise;Gait training;Functional mobility training;Therapeutic activities;Patient/family education;Balance training    PT Goals (Current goals can be found in the Care Plan section)  Acute Rehab PT Goals Patient Stated Goal: possible rehab PT Goal Formulation: With family Time For Goal Achievement: 10/06/21 Potential to Achieve Goals: Good    Frequency Min 2X/week     Co-evaluation               AM-PAC PT "6 Clicks" Mobility  Outcome Measure Help needed turning from your back to your side while in a flat bed without using bedrails?: A Little Help needed moving from lying on your back to sitting on the side of a flat bed without using bedrails?: A Little Help needed moving to and from a bed to a chair (including a wheelchair)?: A Little Help needed standing up from a chair using your arms (e.g., wheelchair or bedside chair)?: A Little Help needed to walk in hospital room?: A Lot Help needed climbing 3-5 steps with a railing? : A Lot 6 Click Score: 16    End of Session Equipment Utilized During Treatment: Gait belt Activity Tolerance: Patient tolerated treatment well Patient left: with call bell/phone  within reach;in chair;with chair alarm set;with family/visitor present   PT Visit Diagnosis: Other abnormalities of gait and mobility (R26.89);Difficulty in walking, not elsewhere classified (R26.2)    Time: 1439-1500 PT Time Calculation (min) (ACUTE ONLY): 21  min   Charges:   PT Evaluation $PT Eval Low Complexity: Pierce, PT  Acute Rehab Dept [facility]) [phone]  [facility] Weekend Pager [facility] only)  [phone]  09/22/2021   [facility] 09/22/2021, 4:00 PM

## 2021-09-22 NOTE — ED Notes (Signed)
Mark, RN taking report at [facility] states call back in 10 minutes.

## 2021-09-22 NOTE — H&P (Signed)
History and Physical    Cheyenne Andrews DTO:[id number] DOB: [birth year] DOA: 09/21/2021  PCP: Kathyrn Lass, MD   Patient coming from: Home   Chief Complaint: Falls, confusion, dizziness   HPI: Cheyenne Andrews is a pleasant 88 y.o. female with medical history significant for hypertension, anxiety, and history of C2 fracture who presents to the emergency department with dizziness, falls, and confusion.    Patient does not recall the circumstances surrounding her falls but is accompanied by her daughter who assists with the history.  She had been seen on 09/16/2021 after a fall with scalp laceration that was stapled.  Since then, she has been increasingly confused.  She has also had severe dizziness and difficulty with her balance which began prior to that fall on 09/16/2021.  She fell again the night of 09/20/2021, resulting in some low back pain.  She denies any fever, dysuria, gross hematuria, or flank pain.  [facility] ED Course: Upon arrival to the ED, patient is found to be afebrile and saturating well on room air with stable blood pressure.  Chemistry panel and CBC are unremarkable.  Urinalysis notable for bacteriuria, ketonuria, and proteinuria.  Plain radiographs of the chest and lumbar spine are negative for acute findings.  No acute findings noted on CT of the head or cervical spine.  She was given IV fluids and IV Rocephin in the emergency department, and then transferred to [facility] for admission.  Review of Systems:  All other systems reviewed and apart from HPI, are negative.  Past Medical History:  Diagnosis Date   C2 cervical fracture ([place])     Past Surgical History:  Procedure Laterality Date   ABDOMINAL HYSTERECTOMY     CATARACT EXTRACTION     SKIN CANCER EXCISION N/A early 2000s   removed from face     Social History:   reports that she has never smoked. She has never used smokeless tobacco. She reports that she does not drink alcohol and does not use drugs.  No  Known Allergies  History reviewed. No pertinent family history.   Prior to Admission medications   Medication Sig Start Date End Date Taking? Authorizing Provider  acetaminophen (TYLENOL) 500 MG tablet Take 1 tablet (500 mg total) by mouth every 6 (six) hours as needed for moderate pain (or Fever >/= 101). 03/26/14   Barton Dubois, MD  aspirin EC 81 MG tablet Take 1 tablet (81 mg total) by mouth daily. 03/26/14   Barton Dubois, MD  Cholecalciferol (VITAMIN D-3) 1000 UNITS CAPS Take 1 capsule by mouth 2 (two) times daily.    [provider]  FLUoxetine (PROZAC) 10 MG capsule Take 10 mg by mouth daily.    [provider]  HYDROcodone-acetaminophen (NORCO/VICODIN) 5-325 MG tablet Take 0.5 tablets by mouth every 6 (six) hours as needed. 09/27/20   Caccavale, Sophia, PA-C  Hypromellose (GENTEAL MILD OP) Apply 1-2 drops to eye 3 (three) times daily as needed (for eye dryness).    [provider]  ibandronate (BONIVA) 150 MG tablet Take 150 mg by mouth every 30 (thirty) days. Take in the morning with a full glass of water, on an empty stomach, and do not take anything else by mouth or lie down for the next 30 min.    [provider]  methocarbamol (ROBAXIN) 500 MG tablet Take 1 tablet (500 mg total) by mouth 2 (two) times daily as needed for muscle spasms. 09/27/20   Caccavale, Sophia, PA-C  Multiple Vitamin (MULTIVITAMIN) tablet Take  1 tablet by mouth daily.    [provider]    Physical Exam: Vitals:   09/21/21 2300 09/22/21 0000 09/22/21 0100 09/22/21 0235  BP: 135/72 (!) 149/61 138/69 (!) 173/78  Pulse: (!) 56 60 60 63  Resp: '18 18 18 18  '$ Temp:  97.9 F (36.6 C)  98.2 F (36.8 C)  TempSrc:  Oral  Oral  SpO2: 97% 98% 98% 98%  Weight:      Height:        Constitutional: NAD, calm  Eyes: PERTLA, lids and conjunctivae normal ENMT: Mucous membranes are moist. Posterior pharynx clear of any exudate or lesions.   Neck: supple, no masses   Respiratory: no wheezing, no crackles. No accessory muscle use.  Cardiovascular: S1 & S2 heard, regular rate and rhythm. No extremity edema.   Abdomen: No distension, no tenderness, soft. Bowel sounds active.  Musculoskeletal: no clubbing / cyanosis. No joint deformity upper and lower extremities.   Skin: no significant rashes, lesions, ulcers. Warm, dry, well-perfused. Neurologic: left lower facial weakness, CN 2-12 grossly intact otherwise. Sensation intact. Moving all extremities. Alert and oriented to person and place but confused regarding situation.  Psychiatric: Very pleasant. Cooperative.    Labs and Imaging on Admission: I have personally reviewed following labs and imaging studies  CBC: Recent Labs  Lab 09/21/21 2101  WBC 6.1  NEUTROABS 3.9  HGB 14.2  HCT 40.9  MCV 93.4  PLT 202   Basic Metabolic Panel: Recent Labs  Lab 09/21/21 2101  NA 137  K 3.7  CL 104  CO2 25  GLUCOSE 94  BUN 13  CREATININE 0.88  CALCIUM 8.4*   GFR: Estimated Creatinine Clearance: 38 mL/min (by C-G formula based on SCr of 0.88 mg/dL). Liver Function Tests: No results for input(s): "AST", "ALT", "ALKPHOS", "BILITOT", "PROT", "ALBUMIN" in the last 168 hours. No results for input(s): "LIPASE", "AMYLASE" in the last 168 hours. No results for input(s): "AMMONIA" in the last 168 hours. Coagulation Profile: No results for input(s): "INR", "PROTIME" in the last 168 hours. Cardiac Enzymes: No results for input(s): "CKTOTAL", "CKMB", "CKMBINDEX", "TROPONINI" in the last 168 hours. BNP (last 3 results) No results for input(s): "PROBNP" in the last 8760 hours. HbA1C: No results for input(s): "HGBA1C" in the last 72 hours. CBG: No results for input(s): "GLUCAP" in the last 168 hours. Lipid Profile: No results for input(s): "CHOL", "HDL", "LDLCALC", "TRIG", "CHOLHDL", "LDLDIRECT" in the last 72 hours. Thyroid Function Tests: No results for input(s): "TSH", "T4TOTAL", "FREET4", "T3FREE",  "THYROIDAB" in the last 72 hours. Anemia Panel: No results for input(s): "VITAMINB12", "FOLATE", "FERRITIN", "TIBC", "IRON", "RETICCTPCT" in the last 72 hours. Urine analysis:    Component Value Date/Time   COLORURINE YELLOW 09/21/2021 2333   APPEARANCEUR CLOUDY (A) 09/21/2021 2333   LABSPEC 1.025 09/21/2021 2333   PHURINE 6.0 09/21/2021 2333   GLUCOSEU NEGATIVE 09/21/2021 2333   HGBUR NEGATIVE 09/21/2021 2333   BILIRUBINUR SMALL (A) 09/21/2021 2333   KETONESUR 40 (A) 09/21/2021 2333   PROTEINUR 30 (A) 09/21/2021 2333   NITRITE NEGATIVE 09/21/2021 2333   LEUKOCYTESUR NEGATIVE 09/21/2021 2333   Sepsis Labs: '@LABRCNTIP'$ (procalcitonin:4,lacticidven:4) )No results found for this or any previous visit (from the past 240 hour(s)).   Radiological Exams on Admission: CT Cervical Spine Wo Contrast  Result Date: 09/22/2021 CLINICAL DATA:  Fall EXAM: CT CERVICAL SPINE WITHOUT CONTRAST TECHNIQUE: Multidetector CT imaging of the cervical spine was performed without intravenous contrast. Multiplanar CT image reconstructions were also generated. RADIATION DOSE  REDUCTION: This exam was performed according to the departmental dose-optimization program which includes automated exposure control, adjustment of the mA and/or kV according to patient size and/or use of iterative reconstruction technique. COMPARISON:  09/17/2021 FINDINGS: Alignment: No traumatic listhesis. Unchanged trace anterolisthesis of C4 on C5, which appears facet mediated. Skull base and vertebrae: No acute fracture or suspicious osseous lesion. Soft tissues and spinal canal: Negative. Disc levels: Multilevel degenerative changes without high-grade spinal canal stenosis Upper chest: Apical pleural-parenchymal scarring. No focal pulmonary opacity or pleural effusion. Other: None. IMPRESSION: No acute fracture or traumatic listhesis in the cervical spine. Electronically Signed   By: Merilyn Baba M.D.   On: 09/22/2021 00:05   DG Chest Portable  1 View  Result Date: 09/21/2021 CLINICAL DATA:  Fall laceration EXAM: PORTABLE CHEST 1 VIEW COMPARISON:  09/27/2020 FINDINGS: No acute airspace disease or effusion. Normal cardiomediastinal silhouette with aortic atherosclerosis. No pneumothorax. IMPRESSION: No active disease. Electronically Signed   By: Donavan Foil M.D.   On: 09/21/2021 23:57   CT Head Wo Contrast  Result Date: 09/21/2021 CLINICAL DATA:  Head trauma, moderate-severe. Pt seen here 9/2 for fall and laceration to head. Pt has had worsening dizziness, additional fall last night with lower back pain and some confusion per family. EXAM: CT HEAD WITHOUT CONTRAST TECHNIQUE: Contiguous axial images were obtained from the base of the skull through the vertex without intravenous contrast. RADIATION DOSE REDUCTION: This exam was performed according to the departmental dose-optimization program which includes automated exposure control, adjustment of the mA and/or kV according to patient size and/or use of iterative reconstruction technique. COMPARISON:  CT head 09/17/2021 FINDINGS: Brain: No evidence of large-territorial acute infarction. No parenchymal hemorrhage. No mass lesion. No extra-axial collection. No mass effect or midline shift. No hydrocephalus. Basilar cisterns are patent. Vascular: No hyperdense vessel. Skull: No acute fracture or focal lesion. Sinuses/Orbits: Paranasal sinuses and mastoid air cells are clear. Bilateral lens replacement. Otherwise the orbits are unremarkable. Other: Right scalp skin staples. IMPRESSION: No acute intracranial abnormality. Electronically Signed   By: Iven Finn M.D.   On: 09/21/2021 20:38   DG Lumbar Spine Complete  Result Date: 09/21/2021 CLINICAL DATA:  Fall, low back pain EXAM: LUMBAR SPINE - COMPLETE 4+ VIEW COMPARISON:  None Available. FINDINGS: Five lumbar-type vertebral bodies. Normal lumbar lordosis.  Mild lumbar levoscoliosis. No evidence of fracture or dislocation. Vertebral body heights are  maintained. Mild degenerative changes of the lower lumbar spine. Visualized bony pelvis appears intact. IMPRESSION: Negative. Electronically Signed   By: Julian Hy M.D.   On: 09/21/2021 20:35     Assessment/Plan   1. Acute encephalopathy - Presents with confusion since a fall with head injury on 09/16/21   - No acute findings on head CT in ED, basic labs unremarkable, and no urinary symptoms  - Mild TBI considered but she had developed severe dizziness and balance difficulty prior to the fall on 09/16/21  - Check MRI brain, ammonia, THS, RPR, and B12, consult PT, use delirium precautions    2. Hypertension  - No longer on any antihypertensives  - Monitor and treat as needed    DVT prophylaxis: Lovenox  Code Status: Full  Level of Care: Level of care: Telemetry Family Communication: Daughter at bedside  Disposition Plan:  Patient is from: home  Anticipated d/c is to: TBD Anticipated d/c date is: Possibly as early as 09/23/21  Patient currently: pending MRI brain, AMS labs, PT eval  Consults called: none Admission status: Observation  Vianne Bulls, MD [facility]  09/22/2021, 5:08 AM

## 2021-09-22 NOTE — Progress Notes (Signed)
SLP Cancellation Note  Patient Details Name: Cheyenne Andrews MRN: [id number] DOB: 1933/08/20   Cancelled treatment:       Reason Eval/Treat Not Completed: Patient at procedure or test/unavailable   Daevon Holdren, Katherene Ponto 09/22/2021, 1:09 PM

## 2021-09-22 NOTE — Consult Note (Signed)
Neurology Consultation  Reason for Consult: Falls, confusion, dizziness, MRI with possible acute/subacute infarctions versus artifact Referring Physician: Dr. Starla Link  CC: Confusion, frequent falls  History is obtained from: Patient, patient's daughter and son-in-law at bedside, chart review  HPI: Cheyenne Andrews is a 88 y.o. female with a medical history significant for hypertension, anxiety, history of C2 fracture who presented to [facility] on 9/7 for evaluation of dizziness, frequent falls, and confusion.  Of note patient was recently seen on 9/2 after a fall with a scalp laceration s/p staple closure and has been increasingly confused since with severe dizziness and balance difficulty.  Of note, patient fell on 9/6 as well with resulting lower back pain.  Further work-up reveals MRI with possible acute/subacute infarctions and a significant vitamin B12 deficiency at 35.  Neurology consulted for further evaluation of patient's acute encephalopathy.  Patient's daughter states that she feels that her mother does have undiagnosed dementia.  The patient lives one straight down from her daughter who checks on her multiple times per day.  The patient only takes one medication but often forgets and her daughter has to keep up with her medication.  The patient's diet is described as poor as she only wants to eat ice cream.  She occasionally eats fried chicken sandwiches and sometimes her daughter brings her salads but states she cannot force her to eat foods that she does not like.  Over the past 6 months to 1 year the patient has reportedly become more confused.  The patient is was to be using a cane or walker at home but often does not utilize these assistive devices.  ROS: A complete ROS was limited due to significant ongoing confusion.   Past Medical History:  Diagnosis Date   C2 cervical fracture (HCC)    Past Surgical History:  Procedure Laterality Date   ABDOMINAL HYSTERECTOMY     CATARACT  EXTRACTION     SKIN CANCER EXCISION N/A early 2000s   removed from face    History reviewed. No pertinent family history.  Social History:   reports that she has never smoked. She has never used smokeless tobacco. She reports that she does not drink alcohol and does not use drugs. Medications  Current Facility-Administered Medications:    0.9 %  sodium chloride infusion, 250 mL, Intravenous, PRN, Opyd, Ilene Qua, MD   acetaminophen (TYLENOL) tablet 650 mg, 650 mg, Oral, Q6H PRN **OR** acetaminophen (TYLENOL) suppository 650 mg, 650 mg, Rectal, Q6H PRN, Opyd, Ilene Qua, MD   aspirin EC tablet 81 mg, 81 mg, Oral, Daily, Alekh, Kshitiz, MD   cyanocobalamin (VITAMIN B12) injection 1,000 mcg, 1,000 mcg, Intramuscular, Q0600, Alekh, Kshitiz, MD, 1,000 mcg at 09/22/21 0938   enoxaparin (LOVENOX) injection 40 mg, 40 mg, Subcutaneous, Q24H, Opyd, Ilene Qua, MD, 40 mg at 09/22/21 8341   hydrALAZINE (APRESOLINE) tablet 25 mg, 25 mg, Oral, Q6H PRN, Opyd, Ilene Qua, MD   phosphorus (K PHOS NEUTRAL) tablet 250 mg, 250 mg, Oral, TID, Alekh, Kshitiz, MD, 250 mg at 09/22/21 0939   senna-docusate (Senokot-S) tablet 1 tablet, 1 tablet, Oral, QHS PRN, Opyd, Ilene Qua, MD   sodium chloride flush (NS) 0.9 % injection 3 mL, 3 mL, Intravenous, Q12H, Opyd, Timothy S, MD, 3 mL at 09/22/21 0938   sodium chloride flush (NS) 0.9 % injection 3 mL, 3 mL, Intravenous, Q12H, Opyd, Timothy S, MD, 3 mL at 09/22/21 0939   sodium chloride flush (NS) 0.9 % injection 3 mL, 3 mL, Intravenous, PRN,  Vianne Bulls, MD  Exam: Current vital signs: BP (!) 150/80 (BP Location: Left Arm)   Pulse 64   Temp 98.7 F (37.1 C) (Oral)   Resp 18   Ht '5\' 3"'$  (1.6 m)   Wt 55 kg   SpO2 96%   BMI 21.48 kg/m  Vital signs in last 24 hours: Temp:  [97.6 F (36.4 C)-98.7 F (37.1 C)] 98.7 F (37.1 C) (09/08 0652) Pulse Rate:  [56-65] 64 (09/08 0652) Resp:  [18] 18 (09/08 0652) BP: (135-173)/(61-80) 150/80 (09/08 0652) SpO2:  [95 %-98  %] 96 % (09/08 0652) Weight:  [55 kg] 55 kg (09/07 1952)  GENERAL: Awake, alert, pleasantly confused Psych: Patient is calm and cooperative with examination but requires frequent reorientation Head: Normocephalic.  On patient's midline superior head, there are staples with a well approximated laceration.  No active hemorrhage noted. EENT: Normal conjunctivae, dry mucous membranes, no OP obstruction LUNGS: Normal respiratory effort. Non-labored breathing on room air.  No audible wheezing. CV: Regular rate and rhythm on telemetry ABDOMEN: Soft, non-tender, non-distended Extremities: warm, well perfused, without obvious deformity  NEURO:  Mental Status: Awake, alert, and oriented to self.  Patient is unable to correctly state the year, month, location, or situation.  Patient incorrectly states that the year is 2016, that her age is 47, and she confabulates with other orientation questions.  Patient requires frequent redirection. She is unable to provide a clear and coherent history of present illness. She intermittently follows simple commands, but requires repeat instruction and demonstration for following two-step commands. Speech/Language: speech is without dysarthria. Naming, is intact without evidence of aphasia.  Patient consistently has 2-3 errors with repetition of phrases. Patient has poor memory recall. No neglect is noted Cranial Nerves:  II: PERRL. Visual fields full.  III, IV, VI: EOMI without gaze preference, nystagmus, ptosis.  V: Sensation is intact to light touch and symmetrical to face. VII: Face is symmetric resting and with movement VIII: Hearing intact to voice IX, X: Palate elevation is symmetric. Phonation normal.  XI: Normal sternocleidomastoid and trapezius muscle strength XII: Tongue protrudes midline without fasciculations.   Motor: 5/5 strength present in bilateral upper extremities.  Bilateral lower extremities are able to withstand antigravity movement with  very slight weakness noted. (On later attending exam 5/5 throughout LE other than mild HF weakness bilaterally) Tone is normal. Bulk is normal.  Sensation: Intact to light touch bilaterally in all four extremities.  Coordination: Bradykinetic movements throughout without overt ataxia on FNF and HKS DTRs: 3+ and symmetric patellae, 2+ brachioradialis Gait: Deferred for patient's safety with multiple recent falls  Labs I have reviewed labs in epic and the results pertinent to this consultation are: CBC    Component Value Date/Time   WBC 5.2 09/22/2021 0349   RBC 4.19 09/22/2021 0349   HGB 13.7 09/22/2021 0349   HCT 39.9 09/22/2021 0349   PLT 187 09/22/2021 0349   MCV 95.2 09/22/2021 0349   MCH 32.7 09/22/2021 0349   MCHC 34.3 09/22/2021 0349   RDW 13.2 09/22/2021 0349   LYMPHSABS 1.4 09/21/2021 2101   MONOABS 0.6 09/21/2021 2101   EOSABS 0.2 09/21/2021 2101   BASOSABS 0.0 09/21/2021 2101   CMP     Component Value Date/Time   NA 139 09/22/2021 0349   K 3.4 (L) 09/22/2021 0349   CL 105 09/22/2021 0349   CO2 25 09/22/2021 0349   GLUCOSE 88 09/22/2021 0349   BUN 12 09/22/2021 0349   CREATININE 0.74  09/22/2021 0349   CALCIUM 8.3 (L) 09/22/2021 0349   PROT 6.4 (L) 09/22/2021 0349   ALBUMIN 3.4 (L) 09/22/2021 0349   AST 17 09/22/2021 0349   ALT 10 09/22/2021 0349   ALKPHOS 72 09/22/2021 0349   BILITOT 0.8 09/22/2021 0349   GFRNONAA >60 09/22/2021 0349   GFRAA 65 (L) 03/25/2014 0405   Lipid Panel     Component Value Date/Time   CHOL 206 (H) 09/22/2021 0349   TRIG 106 09/22/2021 0349   HDL 30 (L) 09/22/2021 0349   CHOLHDL 6.9 09/22/2021 0349   VLDL 21 09/22/2021 0349   LDLCALC 155 (H) 09/22/2021 0349   Lab Results  Component Value Date   HGBA1C 5.0 09/22/2021   Urinalysis    Component Value Date/Time   COLORURINE YELLOW 09/21/2021 2333   APPEARANCEUR CLOUDY (A) 09/21/2021 2333   LABSPEC 1.025 09/21/2021 2333   PHURINE 6.0 09/21/2021 [place]  09/21/2021 2333   HGBUR NEGATIVE 09/21/2021 2333   BILIRUBINUR SMALL (A) 09/21/2021 2333   KETONESUR 40 (A) 09/21/2021 2333   PROTEINUR 30 (A) 09/21/2021 2333   NITRITE NEGATIVE 09/21/2021 2333   LEUKOCYTESUR NEGATIVE 09/21/2021 2333   Drugs of Abuse  No results found for: "LABOPIA", "COCAINSCRNUR", "LABBENZ", "AMPHETMU", "THCU", "LABBARB"   Lab Results  Component Value Date   VITAMINB12 69 (L) 09/22/2021   Lab Results  Component Value Date   TSH 3.249 09/22/2021    RPR Nonreactive Ammonia 25 Imaging I have reviewed the images obtained:  CT-scan of the brain 9/7: No acute intracranial abnormality.  CT cervical spine 9/7: No acute fracture or traumatic listhesis in the cervical spine.  CTA head and neck 9/8: 1. Severe stenosis versus occlusion of the left P2 PCA with distal reconstitution. 2. Severe stenosis of the right P1 and P2 PCA. 3. Moderate left and mild right M1 MCA stenosis.  MRI examination of the brain 9/8: A few punctate foci of mild DWI hyperintensity in left occipital lobe could represent acute or subacute infarcts versus artifact. No significant edema or mass effect.  Echocardiogram 9/28:  1. Left ventricular ejection fraction, by estimation, is 60 to 65%. The  left ventricle has normal function. The left ventricle has no regional  wall motion abnormalities. Left ventricular diastolic parameters were  normal.   2. Right ventricular systolic function is normal. The right ventricular  size is normal. There is normal pulmonary artery systolic pressure.   3. The mitral valve is normal in structure. Trivial mitral valve  regurgitation. No evidence of mitral stenosis.   4. The aortic valve is tricuspid. Aortic valve regurgitation is not  visualized. No aortic stenosis is present.   5. The inferior vena cava is normal in size with greater than 50%  respiratory variability, suggesting right atrial pressure of 3 mmHg.   Assessment: 88 year old female with  medical history as above who presented to [facility] on 9/7 for evaluation of ongoing dizziness, confusion, and recent frequent falls.  Work-up revealed MRI with possible acute/subacute infarctions versus artifact and significant vitamin B12 deficiency.  CT imaging reveals severe stenosis versus occlusion of the left P2 PCA,  severe stenosis right P1 and P2 PCA, and moderate left MCA stenosis. With location of stenosis and MRI findings, do favor stroke over artifact. Will complete stroke work up.   Impression: Dementia B12 deficiency Acute/subacute stroke versus artifact Intracranial stenosis  Recommendations:  Stroke work up, completed on  neurology recommendations - HgbA1c 5.3 at goal of < 7 -  LDL elevated above goal of < 70, initiate high-intensity statin therapy Atorvastatin 80 mg PO daily - Prophylactic therapy- Antiplatelet med: ASA 81 mg PO daily; recommended plavix for 90 days due to severe intracranial stenosis but family declined this due to patient's frequent falls and opted for aspirin monotherapy  - Risk factor modification - PT consult, OT consult, Speech consult - Outpatient neurology follow-up in 6-8 weeks  B12 deficiency - B12 supplementation: IM injections 1,000 mcg daily for 5 days followed by weekly injections for one month, then 1000 mcg PO daily - Follow up outpatient for further labs and supplementation  Confusion -Believe that patient would benefit from outpatient neurocognitive evaluation once her B12 normalizes -Continue to evaluate and treat reversible causes of confusion  -Thiamine, HIV labs pending  -Delirium precautions  Dizziness with ambulation - Orthostatic vital signs - If positive, ted stockings, abdominal binder - If patient does not tolerate ambulation with SBP of 120-140 may need to liberalize goal to SBP < 160  Pt seen by NP/Neuro and later by MD. Note/plan to be edited by MD as needed.  Anibal Henderson, AGAC-NP [facility] Pager: [phone]  Attending Neurologist's note:  I personally saw this patient, gathering history, performing a full neurologic examination, reviewing relevant labs, personally reviewing relevant imaging including MRI, CTA, CT C-spine, and formulated the assessment and plan, adding the note above for completeness and clarity to accurately reflect my thoughts  Lesleigh Noe MD-PhD [facility] [phone]  Available 7 AM to 7 PM, outside these hours please contact Neurologist on call listed on AMION

## 2021-09-22 NOTE — ED Notes (Signed)
SBAR report given to [employer], Therapist, sports at [employer]

## 2021-09-22 NOTE — Progress Notes (Signed)
Patient admitted early this morning for falls, confusion and dizziness.  Patient seen and examined at bedside and plan of care discussed with her.  I have reviewed patient's medical records including this morning's H&P, current vitals, labs and medications myself.  Awaiting MRI of brain.  Vitamin B12 level 69.  Start supplementation.  PT eval.

## 2021-09-23 DIAGNOSIS — G9341 Metabolic encephalopathy: Secondary | ICD-10-CM | POA: Diagnosis not present

## 2021-09-23 DIAGNOSIS — I6529 Occlusion and stenosis of unspecified carotid artery: Secondary | ICD-10-CM

## 2021-09-23 DIAGNOSIS — E876 Hypokalemia: Secondary | ICD-10-CM

## 2021-09-23 DIAGNOSIS — I639 Cerebral infarction, unspecified: Secondary | ICD-10-CM

## 2021-09-23 DIAGNOSIS — G9340 Encephalopathy, unspecified: Secondary | ICD-10-CM | POA: Diagnosis not present

## 2021-09-23 DIAGNOSIS — E538 Deficiency of other specified B group vitamins: Secondary | ICD-10-CM

## 2021-09-23 DIAGNOSIS — E785 Hyperlipidemia, unspecified: Secondary | ICD-10-CM

## 2021-09-23 LAB — CBC
HCT: 40.8 % (ref 36.0–46.0)
Hemoglobin: 13.9 g/dL (ref 12.0–15.0)
MCH: 32.7 pg (ref 26.0–34.0)
MCHC: 34.1 g/dL (ref 30.0–36.0)
MCV: 96 fL (ref 80.0–100.0)
Platelets: 183 10*3/uL (ref 150–400)
RBC: 4.25 MIL/uL (ref 3.87–5.11)
RDW: 13.5 % (ref 11.5–15.5)
WBC: 4.5 10*3/uL (ref 4.0–10.5)
nRBC: 0 % (ref 0.0–0.2)

## 2021-09-23 LAB — BASIC METABOLIC PANEL
Anion gap: 7 (ref 5–15)
BUN: 9 mg/dL (ref 8–23)
CO2: 26 mmol/L (ref 22–32)
Calcium: 8.3 mg/dL — ABNORMAL LOW (ref 8.9–10.3)
Chloride: 107 mmol/L (ref 98–111)
Creatinine, Ser: 0.69 mg/dL (ref 0.44–1.00)
GFR, Estimated: >60 mL/min (ref >60)
Glucose, Bld: 93 mg/dL (ref 70–99)
Potassium: 3.6 mmol/L (ref 3.5–5.1)
Sodium: 140 mmol/L (ref 135–145)

## 2021-09-23 LAB — URINE CULTURE: Culture: NO GROWTH

## 2021-09-23 LAB — MAGNESIUM: Magnesium: 2.3 mg/dL (ref 1.7–2.4)

## 2021-09-23 MED ORDER — FLUOXETINE HCL 20 MG PO CAPS
20.0000 mg | ORAL_CAPSULE | Freq: Every day | ORAL | Status: DC
  Administered 2021-09-23 – 2021-09-26 (×4): 20 mg via ORAL
  Filled 2021-09-23 (×4): qty 1

## 2021-09-23 NOTE — Evaluation (Signed)
Clinical/Bedside Swallow Evaluation Patient Details  Name: Cheyenne Andrews MRN: [id number] Date of Birth: [birth year]  Today's Date: 09/23/2021 Time: SLP Start Time (ACUTE ONLY): 1015 SLP Stop Time (ACUTE ONLY): 1030 SLP Time Calculation (min) (ACUTE ONLY): 15 min  Past Medical History:  Past Medical History:  Diagnosis Date   C2 cervical fracture ([employer])    Past Surgical History:  Past Surgical History:  Procedure Laterality Date   ABDOMINAL HYSTERECTOMY     CATARACT EXTRACTION     SKIN CANCER EXCISION N/A early 2000s   removed from face    HPI:  Cheyenne Andrews is a pleasant 88 y.o. female with medical history significant for hypertension, anxiety, and history of C2 fracture who presents to the emergency department with dizziness, falls, and confusion. MRI shows A few punctate foci of mild DWI hyperintensity in left occipital  lobe could represent acute or subacute infarcts versus artifact    Assessment / Plan / Recommendation  Clinical Impression  Patient is not currently presenting with any clinical s/s of dysphagia as per this bedside/clinical swallow evaluation. Per daughters who were both present in room, she does not have a h/o any swallowing difficulties and that she has always eaten small amounts. Patient was observed with thin liquids (juice, water) while taking her medications. No overt s/s aspiration or penetration and swallow initiation appeared timely. SLP not recommending further intervention for swallow function and she appears safe on regular texture solids, thin liquids.  Of note, patient's daughters spoke with SLP in hallway away from patient and requested that staff do not discuss or mention possibility of SNF or ALF upon discharge. They do not feel that they can care for her at home. SLP informed Education officer, museum.   SLP Visit Diagnosis: Dysphagia, unspecified (R13.10)    Aspiration Risk  No limitations    Diet Recommendation Regular;Thin liquid   Liquid  Administration via: Cup;Straw Medication Administration: Whole meds with liquid Supervision: Patient able to self feed Postural Changes: Seated upright at 90 degrees    Other  Recommendations Oral Care Recommendations: Oral care BID    Recommendations for follow up therapy are one component of a multi-disciplinary discharge planning process, led by the attending physician.  Recommendations may be updated based on patient status, additional functional criteria and insurance authorization.  Follow up Recommendations No SLP follow up      Assistance Recommended at Discharge None  Functional Status Assessment    Frequency and Duration            Prognosis        Swallow Study   General Date of Onset: 09/22/21 HPI: Cheyenne Andrews is a pleasant 88 y.o. female with medical history significant for hypertension, anxiety, and history of C2 fracture who presents to the emergency department with dizziness, falls, and confusion. MRI shows A few punctate foci of mild DWI hyperintensity in left occipital  lobe could represent acute or subacute infarcts versus artifact Type of Study: Bedside Swallow Evaluation Previous Swallow Assessment: none found Diet Prior to this Study: Regular;Thin liquids Temperature Spikes Noted: No Respiratory Status: Room air History of Recent Intubation: No Behavior/Cognition: Alert;Cooperative;Pleasant mood Oral Cavity Assessment: Within Functional Limits Oral Care Completed by SLP: No Vision: Functional for self-feeding Self-Feeding Abilities: Able to feed self Patient Positioning: Upright in chair Baseline Vocal Quality: Normal Volitional Cough: Strong Volitional Swallow: Able to elicit    Oral/Motor/Sensory Function Overall Oral Motor/Sensory Function: Within functional limits   Ice Chips     Thin Liquid Thin  Liquid: Within functional limits Presentation: Straw;Self Fed    Nectar Thick     Honey Thick     Puree Puree: Not tested   Solid      Solid: Not tested Other Comments: Patient had just finished breakfast when SLP arrived and daughter reported she did fine with eggs, grits, bacon      Sonia Baller, MA, CCC-SLP Speech Therapy

## 2021-09-23 NOTE — Evaluation (Signed)
Occupational Therapy Evaluation Patient Details Name: Cheyenne Andrews MRN: [id number] DOB: 12-14-1933 Today's Date: 09/23/2021   History of Present Illness 88 yo female  who presented to the ED with dizziness, falls, and confusion  admitted with acute encephalopathy, multiple falls most recent resulting in  head lac. OVZ:[id number], anxiety, and history of C2 fracture  MRI =punctate foci of mild DWI hyperintensity in left occipital  lobe could represent acute or subacute infarcts versus artifact. No  significant edema or mass effect.   Clinical Impression   Mrs. Cheyenne Andrews is an 88 year old woman who presents with generalized weakness, decreased activity tolerance, back pain and impaired balance resulting in a decline in independence and safety. On evaluation she requires min assist to ambulate due to unsteadiness and min-mod assist for ADLs. No focal neurological deficits found - exhibits functional ROM, strength and coordination of upper extremities. Reports no change in sensation. No visual changes and able to grossly read the menu without her readers. She reports low back pain that is worse when she walks. Her cognition is functional though she does struggle with orientation questions. Her daughter reports recent memory deficits. Patient will benefit from skilled OT services while in hospital to improve deficits and learn compensatory strategies as needed in order to return to PLOF.  Recommend short term rehab at discharge.       Recommendations for follow up therapy are one component of a multi-disciplinary discharge planning process, led by the attending physician.  Recommendations may be updated based on patient status, additional functional criteria and insurance authorization.   Follow Up Recommendations  Skilled nursing-short term rehab (<3 hours/day)    Assistance Recommended at Discharge Frequent or constant Supervision/Assistance  Patient can return home with the following A  little help with walking and/or transfers;A little help with bathing/dressing/bathroom;Assistance with cooking/housework;Assist for transportation;Help with stairs or ramp for entrance;Direct supervision/assist for medications management;Direct supervision/assist for financial management    Functional Status Assessment  Patient has had a recent decline in their functional status and demonstrates the ability to make significant improvements in function in a reasonable and predictable amount of time.  Equipment Recommendations  None recommended by OT    Recommendations for Other Services       Precautions / Restrictions Precautions Precautions: Fall Restrictions Weight Bearing Restrictions: No      Mobility Bed Mobility               General bed mobility comments: up in chair    Transfers Overall transfer level: Needs assistance Equipment used: Rolling walker (2 wheels) Transfers: Sit to/from Stand Sit to Stand: Min guard           General transfer comment: Min guard to stand and min assist to ambulate in room safely with walker and without. Had LOB without walker.      Balance Overall balance assessment: Needs assistance Sitting-balance support: No upper extremity supported, Feet supported Sitting balance-Leahy Scale: Good Sitting balance - Comments: able to bend over and reach for feet   Standing balance support: During functional activity Standing balance-Leahy Scale: Fair                             ADL either performed or assessed with clinical judgement   ADL Overall ADL's : Needs assistance/impaired Eating/Feeding: Set up;Sitting   Grooming: Min guard;Standing   Upper Body Bathing: Set up;Sitting   Lower Body Bathing: Sit to/from stand;Minimal assistance  Upper Body Dressing : Set up;Sitting   Lower Body Dressing: Moderate assistance;Sit to/from stand   Toilet Transfer: Min guard;Rolling walker (2 wheels);Regular Contractor Details (indicate cue type and reason): verbal cues for hand placement Toileting- Clothing Manipulation and Hygiene: Sit to/from stand;Minimal assistance       Functional mobility during ADLs: Min guard;Rolling walker (2 wheels) General ADL Comments: Attempted ambulation without walker. Her step length improved but she had one loss of balance. Is not used to walker and typically uses a cane.     Vision Baseline Vision/History: 1 Wears glasses Patient Visual Report: No change from baseline Vision Assessment?: No apparent visual deficits Additional Comments: Able to read menu and negotiate in room. No reports of new deficits.     Perception     Praxis      Pertinent Vitals/Pain Pain Assessment Pain Assessment: Faces Faces Pain Scale: Hurts little more Pain Location: Low back Pain Descriptors / Indicators: Aching, Grimacing Pain Intervention(s): Repositioned, Limited activity within patient's tolerance     Hand Dominance Right   Extremity/Trunk Assessment Upper Extremity Assessment Upper Extremity Assessment: RUE deficits/detail;LUE deficits/detail RUE Deficits / Details: WFL ROM, 4-/5 shoulder strength, elbow 4/5, wrist 5/5, grip 4/5 RUE Sensation: WNL RUE Coordination: WNL LUE Deficits / Details: WFL ROM, 4-/5 shoulder strength, elbow 4/5, wrist 4/5, grip 4-/5 LUE Sensation: WNL LUE Coordination: WNL   Lower Extremity Assessment Lower Extremity Assessment: Defer to PT evaluation   Cervical / Trunk Assessment Cervical / Trunk Assessment: Normal   Communication Communication Communication: No difficulties   Cognition Arousal/Alertness: Awake/alert Behavior During Therapy: WFL for tasks assessed/performed Overall Cognitive Status: Within Functional Limits for tasks assessed                                 General Comments: Alert to self and a hospital today. Knows her birthdate. Able to state the year and month with increased time. Is able to  follow commands. Answers PLOF qpporpriately and correctly.     General Comments       Exercises     Shoulder Instructions      Home Living Family/patient expects to be discharged to:: Private residence Living Arrangements: Alone Available Help at Discharge: Family;Available PRN/intermittently Type of Home: House Home Access: Stairs to enter [employer] of Steps: 2   Home Layout: One level     Bathroom Shower/Tub: Occupational psychologist: Standard     Home Equipment: Cane - single point;Shower seat   Additional Comments: per dtr pt has been declining recently, needing more assist; no longer drives, though pt says she will.  has 2 other dtrs however the majority of assist fall son dtr currently with pt in hospital and she cannot provide 24hour assist      Prior Functioning/Environment Prior Level of Function : Independent/Modified Independent             Mobility Comments: amb with cane          OT Problem List: Decreased strength;Decreased activity tolerance;Impaired balance (sitting and/or standing);Decreased cognition;Decreased safety awareness;Decreased knowledge of use of DME or AE;Pain      OT Treatment/Interventions: Self-care/ADL training;Therapeutic exercise;DME and/or AE instruction;Therapeutic activities;Balance training;Patient/family education    OT Goals(Current goals can be found in the care plan section) Acute Rehab OT Goals Patient Stated Goal: be independent OT Goal Formulation: With patient Time For Goal Achievement: 10/07/21 Potential to Achieve Goals:  Good  OT Frequency: Min 2X/week    Co-evaluation              AM-PAC OT "6 Clicks" Daily Activity     Outcome Measure Help from another person eating meals?: A Little Help from another person taking care of personal grooming?: A Little Help from another person toileting, which includes using toliet, bedpan, or urinal?: A Little Help from another person bathing  (including washing, rinsing, drying)?: A Little Help from another person to put on and taking off regular upper body clothing?: A Little Help from another person to put on and taking off regular lower body clothing?: A Lot 6 Click Score: 17   End of Session Equipment Utilized During Treatment: Rolling walker (2 wheels);Gait belt Nurse Communication:  (okay to see)  Activity Tolerance: Patient tolerated treatment well Patient left: in chair;with call bell/phone within reach;with family/visitor present  OT Visit Diagnosis: Unsteadiness on feet (R26.81)                Time: 3220-2542 OT Time Calculation (min): 19 min Charges:  OT General Charges $OT Visit: 1 Visit OT Evaluation $OT Eval Low Complexity: 1 Low  Gustavo Lah, OTR/L [place]  Office [phone]   Lenward Chancellor 09/23/2021, 11:34 AM

## 2021-09-23 NOTE — Progress Notes (Signed)
PROGRESS NOTE    [facility]  YYT:[id number] DOB: 07/01/1933 DOA: 09/21/2021 PCP: Cheyenne Lass, MD   Brief Narrative:  88 y.o. female with medical history significant for hypertension, anxiety, and history of C2 fracture, possible undiagnosed dementia presented with dizziness, falls and worsening confusion after recent fall on 09/16/2021; apparently fell again the night of 09/20/2021.  On presentation, skin radiographs of the chest and lumbar spine were negative for acute findings.  CT of the head/cervical spine was negative for acute findings.  Assessment & Plan:   Acute metabolic encephalopathy Possibly concussion after a fall Possible undiagnosed dementia, worsening -Patient has undiagnosed dementia which is possibly worsening -Mental status has worsened after recent fall on 09/16/2021. -Imaging negative for fractures -Monitor mental status.  Fall precautions.  PT recommending SNF placement.  TOC consult.  Possible acute/subacute stroke versus artifact Severe stenosis versus occlusion of the left P2 PCA, severe stenosis right P1 and P2 PCA, moderate left MCA stenosis -MRI of brain showed acute/subacute infarctions versus artifact. -CTA head and neck showed findings as above. -Neurology has evaluated the patient and recommended that patient can stay at [facility] long and does not need to be transferred to [facility].  Currently on aspirin 81 mg daily along with Lipitor 80 mg daily.  Neurology recommended Plavix for 90 days but family refused it.  Outpatient follow-up with neurology. -SLP evaluation. -LDL 155.  A1c 5. -2D echo showed EF of 60 to 65%  Vitamin B12 deficiency -Vitamin B12 level 69.  Continue parenteral supplementation while in the hospital and oral supplementation on discharge.  Goals of care -Discussed with daughter at bedside who confirms DNR status.  Change CODE STATUS to DNR.  Hypokalemia -Improved   hypertension -monitor blood pressure.  Not on  antihypertensives as an outpatient  Hyperlipidemia -Lipitor as above  DVT prophylaxis: Lovenox Code Status: DNR.  Confirmed with the daughter Family Communication: Daughter at bedside Disposition Plan: Status is: Observation The patient will require care spanning > 2 midnights and should be moved to inpatient because: Will need SNF placement  Consultants: Neurology  Procedures: 2D echo  Antimicrobials: None   Subjective: Patient seen and examined at bedside.  Awake but confused.  Daughter at bedside.  No overnight fever, vomiting, agitation or seizures reported.  Objective: Vitals:   09/23/21 0942 09/23/21 0944 09/23/21 0948 09/23/21 1000  BP:      Pulse:      Resp:    20  Temp:      TempSrc:      SpO2: 95% 95% 99% 99%  Weight:      Height:        Intake/Output Summary (Last 24 hours) at 09/23/2021 1103 Last data filed at 09/23/2021 1000 Gross per 24 hour  Intake 360 ml  Output --  Net 360 ml   Filed Weights   09/21/21 1952 09/23/21 0500  Weight: 55 kg 54.2 kg    Examination:  General exam: Appears calm and comfortable.  Elderly female lying in bed.  On room air.  Poor historian. Respiratory system: Bilateral decreased breath sounds at bases with some scattered crackles Cardiovascular system: S1 & S2 heard, Rate controlled Gastrointestinal system: Abdomen is nondistended, soft and nontender. Normal bowel sounds heard. Extremities: No cyanosis, clubbing; trace lower extremity edema Central nervous system: Awake but pleasantly confused.  No focal neurological deficits. Moving extremities Skin: No rashes, lesions or ulcers Psychiatry: Flat affect.  Currently not agitated.   Data Reviewed: I have personally reviewed following labs  and imaging studies  CBC: Recent Labs  Lab 09/21/21 2101 09/22/21 0349 09/23/21 0432  WBC 6.1 5.2 4.5  NEUTROABS 3.9  --   --   HGB 14.2 13.7 13.9  HCT 40.9 39.9 40.8  MCV 93.4 95.2 96.0  PLT 200 187 532   Basic Metabolic  Panel: Recent Labs  Lab 09/21/21 2101 09/22/21 0349 09/23/21 0432  NA 137 139 140  K 3.7 3.4* 3.6  CL 104 105 107  CO2 '25 25 26  '$ GLUCOSE 94 88 93  BUN '13 12 9  '$ CREATININE 0.88 0.74 0.69  CALCIUM 8.4* 8.3* 8.3*  MG  --  2.0 2.3  PHOS  --  1.3*  --    GFR: Estimated Creatinine Clearance: 41.8 mL/min (by C-G formula based on SCr of 0.69 mg/dL). Liver Function Tests: Recent Labs  Lab 09/22/21 0349  AST 17  ALT 10  ALKPHOS 72  BILITOT 0.8  PROT 6.4*  ALBUMIN 3.4*   No results for input(s): "LIPASE", "AMYLASE" in the last 168 hours. Recent Labs  Lab 09/22/21 0349  AMMONIA 25   Coagulation Profile: No results for input(s): "INR", "PROTIME" in the last 168 hours. Cardiac Enzymes: No results for input(s): "CKTOTAL", "CKMB", "CKMBINDEX", "TROPONINI" in the last 168 hours. BNP (last 3 results) No results for input(s): "PROBNP" in the last 8760 hours. HbA1C: Recent Labs    09/22/21 0349  HGBA1C 5.0   CBG: No results for input(s): "GLUCAP" in the last 168 hours. Lipid Profile: Recent Labs    09/22/21 0349  CHOL 206*  HDL 30*  LDLCALC 155*  TRIG 106  CHOLHDL 6.9   Thyroid Function Tests: Recent Labs    09/22/21 0349  TSH 3.249   Anemia Panel: Recent Labs    09/22/21 0349  VITAMINB12 69*   Sepsis Labs: No results for input(s): "PROCALCITON", "LATICACIDVEN" in the last 168 hours.  No results found for this or any previous visit (from the past 240 hour(s)).       Radiology Studies: ECHOCARDIOGRAM COMPLETE  Result Date: 09/22/2021    ECHOCARDIOGRAM REPORT   Patient Name:   Cheyenne Andrews Date of Exam: 09/22/2021 Medical Rec #:  [id number]        Height:       63.0 in Accession #:    [id number]       Weight:       121.3 lb Date of Birth:  Sep 08, 1933        BSA:          1.563 m Patient Age:    58 years         BP:           150/80 mmHg Patient Gender: F                HR:           61 bpm. Exam Location:  Inpatient Procedure: 2D Echo Indications:     stroke  History:        Patient has prior history of Echocardiogram examinations, most                 recent 03/26/2014. CHF, Signs/Symptoms:Syncope; Risk                 Factors:Hypertension.  Sonographer:    Cheyenne Andrews Referring Phys: [id number] Cheyenne Andrews  Sonographer Comments: Technically difficult study due to poor echo windows. Image acquisition challenging due to respiratory motion and patient supine. IMPRESSIONS  1. Left ventricular  ejection fraction, by estimation, is 60 to 65%. The left ventricle has normal function. The left ventricle has no regional wall motion abnormalities. Left ventricular diastolic parameters were normal.  2. Right ventricular systolic function is normal. The right ventricular size is normal. There is normal pulmonary artery systolic pressure.  3. The mitral valve is normal in structure. Trivial mitral valve regurgitation. No evidence of mitral stenosis.  4. The aortic valve is tricuspid. Aortic valve regurgitation is not visualized. No aortic stenosis is present.  5. The inferior vena cava is normal in size with greater than 50% respiratory variability, suggesting right atrial pressure of 3 mmHg. Comparison(s): No prior Echocardiogram. FINDINGS  Left Ventricle: Left ventricular ejection fraction, by estimation, is 60 to 65%. The left ventricle has normal function. The left ventricle has no regional wall motion abnormalities. The left ventricular internal cavity size was normal in size. There is  no left ventricular hypertrophy. Left ventricular diastolic parameters were normal. Right Ventricle: The right ventricular size is normal. Right ventricular systolic function is normal. There is normal pulmonary artery systolic pressure. The tricuspid regurgitant velocity is 2.38 m/s, and with an assumed right atrial pressure of 3 mmHg,  the estimated right ventricular systolic pressure is 50.0 mmHg. Left Atrium: Left atrial size was normal in size. Right Atrium: Right atrial size was  normal in size. Pericardium: There is no evidence of pericardial effusion. Mitral Valve: The mitral valve is normal in structure. Trivial mitral valve regurgitation. No evidence of mitral valve stenosis. Tricuspid Valve: The tricuspid valve is normal in structure. Tricuspid valve regurgitation is trivial. No evidence of tricuspid stenosis. Aortic Valve: The aortic valve is tricuspid. Aortic valve regurgitation is not visualized. No aortic stenosis is present. Aortic valve mean gradient measures 5.0 mmHg. Aortic valve peak gradient measures 9.7 mmHg. Aortic valve area, by VTI measures 1.96 cm. Pulmonic Valve: The pulmonic valve was not well visualized. Pulmonic valve regurgitation is trivial. No evidence of pulmonic stenosis. Aorta: The aortic root is normal in size and structure. Venous: The inferior vena cava is normal in size with greater than 50% respiratory variability, suggesting right atrial pressure of 3 mmHg. IAS/Shunts: The interatrial septum was not well visualized.  LEFT VENTRICLE PLAX 2D LVIDd:         3.70 cm     Diastology LVIDs:         2.70 cm     LV e' medial:    8.38 cm/s LV PW:         0.90 cm     LV E/e' medial:  10.1 LV IVS:        1.10 cm     LV e' lateral:   8.27 cm/s LVOT diam:     1.70 cm     LV E/e' lateral: 10.3 LV SV:         62 LV SV Index:   40 LVOT Area:     2.27 cm  LV Volumes (MOD) LV vol d, MOD A2C: 41.4 ml LV vol d, MOD A4C: 63.6 ml LV vol s, MOD A2C: 16.9 ml LV vol s, MOD A4C: 25.9 ml LV SV MOD A2C:     24.5 ml LV SV MOD A4C:     63.6 ml LV SV MOD BP:      30.5 ml RIGHT VENTRICLE RV Basal diam:  3.30 cm RV Mid diam:    2.70 cm RV S prime:     12.70 cm/s TAPSE (M-mode): 1.6 cm LEFT ATRIUM  Index        RIGHT ATRIUM          Index LA diam:        2.80 cm 1.79 cm/m   RA Area:     7.72 cm LA Vol (A2C):   27.3 ml 17.46 ml/m  RA Volume:   13.00 ml 8.32 ml/m LA Vol (A4C):   35.4 ml 22.65 ml/m LA Biplane Vol: 33.5 ml 21.43 ml/m  AORTIC VALVE                     PULMONIC  VALVE AV Area (Vmax):    1.82 cm      PV Vmax:          0.99 m/s AV Area (Vmean):   1.75 cm      PV Peak grad:     3.9 mmHg AV Area (VTI):     1.96 cm      PR End Diast Vel: 2.40 msec AV Vmax:           156.00 cm/s AV Vmean:          103.000 cm/s AV VTI:            0.317 m AV Peak Grad:      9.7 mmHg AV Mean Grad:      5.0 mmHg LVOT Vmax:         125.00 cm/s LVOT Vmean:        79.600 cm/s LVOT VTI:          0.274 m LVOT/AV VTI ratio: 0.86  AORTA Ao Root diam: 2.80 cm Ao Asc diam:  2.70 cm MITRAL VALVE               TRICUSPID VALVE MV Area (PHT): 3.72 cm    TR Peak grad:   22.7 mmHg MV Decel Time: 204 msec    TR Vmax:        238.00 cm/s MR Peak grad: 21.5 mmHg MR Vmax:      232.00 cm/s  SHUNTS MV E velocity: 84.80 cm/s  Systemic VTI:  0.27 m MV A velocity: 79.30 cm/s  Systemic Diam: 1.70 cm MV E/A ratio:  1.07 Kirk Ruths MD Electronically signed by Kirk Ruths MD Signature Date/Time: 09/22/2021/2:23:51 PM    Final    CT ANGIO HEAD W OR WO CONTRAST  Result Date: 09/22/2021 CLINICAL DATA:  Stroke, follow up EXAM: CT ANGIOGRAPHY HEAD AND NECK TECHNIQUE: Multidetector CT imaging of the head and neck was performed using the standard protocol during bolus administration of intravenous contrast. Multiplanar CT image reconstructions and MIPs were obtained to evaluate the vascular anatomy. Carotid stenosis measurements (when applicable) are obtained utilizing NASCET criteria, using the distal internal carotid diameter as the denominator. RADIATION DOSE REDUCTION: This exam was performed according to the departmental dose-optimization program which includes automated exposure control, adjustment of the mA and/or kV according to patient size and/or use of iterative reconstruction technique. CONTRAST:  57m OMNIPAQUE IOHEXOL 350 MG/ML SOLN COMPARISON:  None Available. FINDINGS: CT HEAD FINDINGS Brain: Small possible infarcts in the left occipital lobe better characterized on same day MRI. Patchy white matter  hypodensities, nonspecific but compatible with chronic microvascular disease. No evidence of acute hemorrhage, mass lesion, midline shift or hydrocephalus. Cerebral atrophy with ex vacuo ventricular dilation. Vascular: See below. Skull: No acute fracture. Sinuses/Orbits: Visualized sinuses are clear. No acute orbital findings. Other: No mastoid effusions. Review of the MIP images confirms the above findings CTA  NECK FINDINGS Aortic arch: Great vessel origins are patent. Right carotid system: Retropharyngeal course of the common carotid artery. Common carotid artery and internal carotid artery patent without greater than 50% stenosis. Left carotid system: Retropharyngeal course of the common carotid artery. Common carotid artery and internal carotid artery patent without greater than 50% stenosis. Vertebral arteries: Left dominant. Patent bilaterally without significant (greater than 50%) stenosis. Skeleton: No acute findings. Other neck: Thyroid nodules, measuring up to 8 mm. These do not require further imaging follow-up (ref: J Am Coll Radiol. 2015 Feb;12(2): 143-50). Upper chest: Visualized lung apices are clear. Review of the MIP images confirms the above findings CTA HEAD FINDINGS Anterior circulation: Mild atherosclerosis of bilateral intracranial ICAs which are mildly narrowed. Moderate left and mild right M1 MCA stenosis. ACAs are patent bilaterally. Posterior circulation: Bilateral intradural vertebral arteries and basilar artery are patent without significant stenosis. Severe stenosis versus occlusion of the left P2 PCA (for example, series 12, image 97) with distal reconstitution. Severe stenosis of the right P1 and P2 PCA. Venous sinuses: As permitted by contrast timing, patent. Review of the MIP images confirms the above findings IMPRESSION: 1. Severe stenosis versus occlusion of the left P2 PCA with distal reconstitution. 2. Severe stenosis of the right P1 and P2 PCA. 3. Moderate left and mild right M1  MCA stenosis. These results will be called to the ordering clinician or representative by the Radiologist Assistant, and communication documented in the PACS or [employer]. Electronically Signed   By: Margaretha Sheffield M.D.   On: 09/22/2021 12:26   CT ANGIO NECK W OR WO CONTRAST  Result Date: 09/22/2021 CLINICAL DATA:  Stroke, follow up EXAM: CT ANGIOGRAPHY HEAD AND NECK TECHNIQUE: Multidetector CT imaging of the head and neck was performed using the standard protocol during bolus administration of intravenous contrast. Multiplanar CT image reconstructions and MIPs were obtained to evaluate the vascular anatomy. Carotid stenosis measurements (when applicable) are obtained utilizing NASCET criteria, using the distal internal carotid diameter as the denominator. RADIATION DOSE REDUCTION: This exam was performed according to the departmental dose-optimization program which includes automated exposure control, adjustment of the mA and/or kV according to patient size and/or use of iterative reconstruction technique. CONTRAST:  38m OMNIPAQUE IOHEXOL 350 MG/ML SOLN COMPARISON:  None Available. FINDINGS: CT HEAD FINDINGS Brain: Small possible infarcts in the left occipital lobe better characterized on same day MRI. Patchy white matter hypodensities, nonspecific but compatible with chronic microvascular disease. No evidence of acute hemorrhage, mass lesion, midline shift or hydrocephalus. Cerebral atrophy with ex vacuo ventricular dilation. Vascular: See below. Skull: No acute fracture. Sinuses/Orbits: Visualized sinuses are clear. No acute orbital findings. Other: No mastoid effusions. Review of the MIP images confirms the above findings CTA NECK FINDINGS Aortic arch: Great vessel origins are patent. Right carotid system: Retropharyngeal course of the common carotid artery. Common carotid artery and internal carotid artery patent without greater than 50% stenosis. Left carotid system: Retropharyngeal course of the  common carotid artery. Common carotid artery and internal carotid artery patent without greater than 50% stenosis. Vertebral arteries: Left dominant. Patent bilaterally without significant (greater than 50%) stenosis. Skeleton: No acute findings. Other neck: Thyroid nodules, measuring up to 8 mm. These do not require further imaging follow-up (ref: J Am Coll Radiol. 2015 Feb;12(2): 143-50). Upper chest: Visualized lung apices are clear. Review of the MIP images confirms the above findings CTA HEAD FINDINGS Anterior circulation: Mild atherosclerosis of bilateral intracranial ICAs which are mildly narrowed. Moderate left and  mild right M1 MCA stenosis. ACAs are patent bilaterally. Posterior circulation: Bilateral intradural vertebral arteries and basilar artery are patent without significant stenosis. Severe stenosis versus occlusion of the left P2 PCA (for example, series 12, image 97) with distal reconstitution. Severe stenosis of the right P1 and P2 PCA. Venous sinuses: As permitted by contrast timing, patent. Review of the MIP images confirms the above findings IMPRESSION: 1. Severe stenosis versus occlusion of the left P2 PCA with distal reconstitution. 2. Severe stenosis of the right P1 and P2 PCA. 3. Moderate left and mild right M1 MCA stenosis. These results will be called to the ordering clinician or representative by the Radiologist Assistant, and communication documented in the PACS or [employer]. Electronically Signed   By: Margaretha Sheffield M.D.   On: 09/22/2021 12:26   MR BRAIN WO CONTRAST  Result Date: 09/22/2021 CLINICAL DATA:  Dizziness, persistent/recurrent, cardiac or vascular cause suspected EXAM: MRI HEAD WITHOUT CONTRAST TECHNIQUE: Multiplanar, multiecho pulse sequences of the brain and surrounding structures were obtained without intravenous contrast. COMPARISON:  CT head 09/21/2021. FINDINGS: Brain: A few punctate foci of mild DWI hyperintensity in left occipital lobe without  clear/definite ADC correlate (for example series 8, images 65/66). No significant edema or mass effect. No acute hemorrhage, hydrocephalus, extra-axial collection or mass lesion. Cerebral atrophy. Patchy T2/FLAIR hyperintensity in the white matter, nonspecific but compatible with chronic microvascular ischemic disease. Vascular: Major arterial flow voids are maintained at the skull base Skull and upper cervical spine: Normal marrow signal. Sinuses/Orbits: Clear sinuses.  No acute orbital findings Other: No mastoid effusions. IMPRESSION: A few punctate foci of mild DWI hyperintensity in left occipital lobe could represent acute or subacute infarcts versus artifact. No significant edema or mass effect. Electronically Signed   By: Margaretha Sheffield M.D.   On: 09/22/2021 09:43   CT Cervical Spine Wo Contrast  Result Date: 09/22/2021 CLINICAL DATA:  Fall EXAM: CT CERVICAL SPINE WITHOUT CONTRAST TECHNIQUE: Multidetector CT imaging of the cervical spine was performed without intravenous contrast. Multiplanar CT image reconstructions were also generated. RADIATION DOSE REDUCTION: This exam was performed according to the departmental dose-optimization program which includes automated exposure control, adjustment of the mA and/or kV according to patient size and/or use of iterative reconstruction technique. COMPARISON:  09/17/2021 FINDINGS: Alignment: No traumatic listhesis. Unchanged trace anterolisthesis of C4 on C5, which appears facet mediated. Skull base and vertebrae: No acute fracture or suspicious osseous lesion. Soft tissues and spinal canal: Negative. Disc levels: Multilevel degenerative changes without high-grade spinal canal stenosis Upper chest: Apical pleural-parenchymal scarring. No focal pulmonary opacity or pleural effusion. Other: None. IMPRESSION: No acute fracture or traumatic listhesis in the cervical spine. Electronically Signed   By: Merilyn Baba M.D.   On: 09/22/2021 00:05   DG Chest Portable 1  View  Result Date: 09/21/2021 CLINICAL DATA:  Fall laceration EXAM: PORTABLE CHEST 1 VIEW COMPARISON:  09/27/2020 FINDINGS: No acute airspace disease or effusion. Normal cardiomediastinal silhouette with aortic atherosclerosis. No pneumothorax. IMPRESSION: No active disease. Electronically Signed   By: Donavan Foil M.D.   On: 09/21/2021 23:57   CT Head Wo Contrast  Result Date: 09/21/2021 CLINICAL DATA:  Head trauma, moderate-severe. Pt seen here 9/2 for fall and laceration to head. Pt has had worsening dizziness, additional fall last night with lower back pain and some confusion per family. EXAM: CT HEAD WITHOUT CONTRAST TECHNIQUE: Contiguous axial images were obtained from the base of the skull through the vertex without intravenous contrast. RADIATION DOSE  REDUCTION: This exam was performed according to the departmental dose-optimization program which includes automated exposure control, adjustment of the mA and/or kV according to patient size and/or use of iterative reconstruction technique. COMPARISON:  CT head 09/17/2021 FINDINGS: Brain: No evidence of large-territorial acute infarction. No parenchymal hemorrhage. No mass lesion. No extra-axial collection. No mass effect or midline shift. No hydrocephalus. Basilar cisterns are patent. Vascular: No hyperdense vessel. Skull: No acute fracture or focal lesion. Sinuses/Orbits: Paranasal sinuses and mastoid air cells are clear. Bilateral lens replacement. Otherwise the orbits are unremarkable. Other: Right scalp skin staples. IMPRESSION: No acute intracranial abnormality. Electronically Signed   By: Iven Finn M.D.   On: 09/21/2021 20:38   DG Lumbar Spine Complete  Result Date: 09/21/2021 CLINICAL DATA:  Fall, low back pain EXAM: LUMBAR SPINE - COMPLETE 4+ VIEW COMPARISON:  None Available. FINDINGS: Five lumbar-type vertebral bodies. Normal lumbar lordosis.  Mild lumbar levoscoliosis. No evidence of fracture or dislocation. Vertebral body heights are  maintained. Mild degenerative changes of the lower lumbar spine. Visualized bony pelvis appears intact. IMPRESSION: Negative. Electronically Signed   By: Julian Hy M.D.   On: 09/21/2021 20:35        Scheduled Meds:  aspirin EC  81 mg Oral Daily   atorvastatin  80 mg Oral Daily   cyanocobalamin  1,000 mcg Intramuscular Q0600   enoxaparin (LOVENOX) injection  40 mg Subcutaneous Q24H   phosphorus  250 mg Oral TID   sodium chloride flush  3 mL Intravenous Q12H   sodium chloride flush  3 mL Intravenous Q12H   Continuous Infusions:  sodium chloride            Aline August, MD [facility] 09/23/2021, 11:03 AM '

## 2021-09-24 DIAGNOSIS — I639 Cerebral infarction, unspecified: Secondary | ICD-10-CM | POA: Diagnosis not present

## 2021-09-24 DIAGNOSIS — G9340 Encephalopathy, unspecified: Secondary | ICD-10-CM | POA: Diagnosis not present

## 2021-09-24 DIAGNOSIS — E538 Deficiency of other specified B group vitamins: Secondary | ICD-10-CM | POA: Diagnosis not present

## 2021-09-24 DIAGNOSIS — G9341 Metabolic encephalopathy: Secondary | ICD-10-CM | POA: Diagnosis not present

## 2021-09-24 NOTE — Progress Notes (Signed)
PROGRESS NOTE    Cheyenne Andrews  IDP:[id number] DOB: [birth year] DOA: 09/21/2021 PCP: Kathyrn Lass, MD   Brief Narrative:  88 y.o. female with medical history significant for hypertension, anxiety, and history of C2 fracture, possible undiagnosed dementia presented with dizziness, falls and worsening confusion after recent fall on 09/16/2021; apparently fell again the night of 09/20/2021.  On presentation, skin radiographs of the chest and lumbar spine were negative for acute findings.  CT of the head/cervical spine was negative for acute findings.  Assessment & Plan:   Acute metabolic encephalopathy Possible concussion after a fall Possible undiagnosed dementia, worsening -Patient has undiagnosed dementia which is possibly worsening -Mental status has worsened after recent fall on 09/16/2021. -Imaging negative for fractures -Monitor mental status.  Fall precautions.  PT recommending SNF placement.  TOC consult.  Possible acute/subacute stroke versus artifact Severe stenosis versus occlusion of the left P2 PCA, severe stenosis right P1 and P2 PCA, moderate left MCA stenosis -MRI of brain showed acute/subacute infarctions versus artifact. -CTA head and neck showed findings as above. -Neurology has evaluated the patient and recommended that patient can stay at [facility] long and does not need to be transferred to [facility].  Currently on aspirin 81 mg daily along with Lipitor 80 mg daily.  Neurology recommended Plavix for 90 days but family refused it.  Outpatient follow-up with neurology. -SLP evaluation. -LDL 155.  A1c 5. -2D echo showed EF of 60 to 65%  Vitamin B12 deficiency -Vitamin B12 level 69.  Continue parenteral supplementation while in the hospital and oral supplementation on discharge.  Goals of care -Discussed with daughter at bedside who confirms DNR status.  Change CODE STATUS to DNR. -Recommend outpatient palliative care evaluation and  follow-up  Hypokalemia -Improved   hypertension -monitor blood pressure.  Not on antihypertensives as an outpatient  Hyperlipidemia -Lipitor as above  DVT prophylaxis: Lovenox Code Status: DNR.  Family Communication: Daughter at bedside Disposition Plan: Status is: Observation The patient will require care spanning > 2 midnights and should be moved to inpatient because: Will need SNF placement  Consultants: Neurology  Procedures: 2D echo  Antimicrobials: None   Subjective: Patient seen and examined at bedside.  Still confused. Daughter at bedside reports increased oral intake.  No seizures, agitation, fever or vomiting reported. Objective: Vitals:   09/23/21 1000 09/23/21 1313 09/23/21 2154 09/24/21 0549  BP:  (!) 108/54 (!) 133/59 (!) 148/74  Pulse:  65 65 (!) 58  Resp: '20 18 20 18  '$ Temp:  98.8 F (37.1 C) 98.9 F (37.2 C) 98.4 F (36.9 C)  TempSrc:  Oral Oral Oral  SpO2: 99% 96% 95% 97%  Weight:      Height:        Intake/Output Summary (Last 24 hours) at 09/24/2021 0741 Last data filed at 09/23/2021 2253 Gross per 24 hour  Intake 600 ml  Output 400 ml  Net 200 ml    Filed Weights   09/21/21 1952 09/23/21 0500  Weight: 55 kg 54.2 kg    Examination:  General exam: No distress.  On room air currently.  Elderly female lying in bed.  Awake, slightly confused, participating in conversation more today.  Poor historian. Respiratory system: Decreased breath sounds at bases bilaterally with some crackles  cardiovascular system: Mostly rate controlled; S1 and S2 heard gastrointestinal system: Abdomen is distended slightly; soft and nontender.  Bowel sounds are heard Extremities: Mild lower extremity edema present; no clubbing.  Data Reviewed: I have personally reviewed following  labs and imaging studies  CBC: Recent Labs  Lab 09/21/21 2101 09/22/21 0349 09/23/21 0432  WBC 6.1 5.2 4.5  NEUTROABS 3.9  --   --   HGB 14.2 13.7 13.9  HCT 40.9 39.9 40.8  MCV  93.4 95.2 96.0  PLT 200 187 416    Basic Metabolic Panel: Recent Labs  Lab 09/21/21 2101 09/22/21 0349 09/23/21 0432  NA 137 139 140  K 3.7 3.4* 3.6  CL 104 105 107  CO2 '25 25 26  '$ GLUCOSE 94 88 93  BUN '13 12 9  '$ CREATININE 0.88 0.74 0.69  CALCIUM 8.4* 8.3* 8.3*  MG  --  2.0 2.3  PHOS  --  1.3*  --     GFR: Estimated Creatinine Clearance: 41.8 mL/min (by C-G formula based on SCr of 0.69 mg/dL). Liver Function Tests: Recent Labs  Lab 09/22/21 0349  AST 17  ALT 10  ALKPHOS 72  BILITOT 0.8  PROT 6.4*  ALBUMIN 3.4*    No results for input(s): "LIPASE", "AMYLASE" in the last 168 hours. Recent Labs  Lab 09/22/21 0349  AMMONIA 25    Coagulation Profile: No results for input(s): "INR", "PROTIME" in the last 168 hours. Cardiac Enzymes: No results for input(s): "CKTOTAL", "CKMB", "CKMBINDEX", "TROPONINI" in the last 168 hours. BNP (last 3 results) No results for input(s): "PROBNP" in the last 8760 hours. HbA1C: Recent Labs    09/22/21 0349  HGBA1C 5.0    CBG: No results for input(s): "GLUCAP" in the last 168 hours. Lipid Profile: Recent Labs    09/22/21 0349  CHOL 206*  HDL 30*  LDLCALC 155*  TRIG 106  CHOLHDL 6.9    Thyroid Function Tests: Recent Labs    09/22/21 0349  TSH 3.249    Anemia Panel: Recent Labs    09/22/21 0349  VITAMINB12 69*    Sepsis Labs: No results for input(s): "PROCALCITON", "LATICACIDVEN" in the last 168 hours.  Recent Results (from the past 240 hour(s))  Urine Culture     Status: None   Collection Time: 09/22/21 12:56 PM   Specimen: Urine, Clean Catch  Result Value Ref Range Status   Specimen Description   Final    URINE, CLEAN CATCH Performed at [facility], [place] [address], [place], [place] [address]    Special Requests   Final    NONE Performed at [facility], [place] [address], [place], [place] [address]    Culture   Final    NO GROWTH Performed at [facility], [place] [address], [place], [place] [address]    Report Status 09/23/2021 FINAL  Final         Radiology Studies: ECHOCARDIOGRAM COMPLETE  Result Date: 09/22/2021    ECHOCARDIOGRAM REPORT   Patient Name:   RENESME KERRIGAN Date of Exam: 09/22/2021 Medical Rec #:  [id number]        Height:       63.0 in Accession #:    [id number]       Weight:       121.3 lb Date of Birth:  [birth year]        BSA:          1.563 m Patient Age:    76 years         BP:           150/80 mmHg Patient Gender: F                HR:  61 bpm. Exam Location:  Inpatient Procedure: 2D Echo Indications:    stroke  History:        Patient has prior history of Echocardiogram examinations, most                 recent 03/26/2014. CHF, Signs/Symptoms:Syncope; Risk                 Factors:Hypertension.  Sonographer:    Harvie Junior Referring Phys: [id number] [facility]  Sonographer Comments: Technically difficult study due to poor echo windows. Image acquisition challenging due to respiratory motion and patient supine. IMPRESSIONS  1. Left ventricular ejection fraction, by estimation, is 60 to 65%. The left ventricle has normal function. The left ventricle has no regional wall motion abnormalities. Left ventricular diastolic parameters were normal.  2. Right ventricular systolic function is normal. The right ventricular size is normal. There is normal pulmonary artery systolic pressure.  3. The mitral valve is normal in structure. Trivial mitral valve regurgitation. No evidence of mitral stenosis.  4. The aortic valve is tricuspid. Aortic valve regurgitation is not visualized. No aortic stenosis is present.  5. The inferior vena cava is normal in size with greater than 50% respiratory variability, suggesting right atrial pressure of 3 mmHg. Comparison(s): No prior Echocardiogram. FINDINGS  Left Ventricle: Left ventricular ejection fraction, by estimation, is 60 to 65%. The left ventricle has normal function. The left ventricle has  no regional wall motion abnormalities. The left ventricular internal cavity size was normal in size. There is  no left ventricular hypertrophy. Left ventricular diastolic parameters were normal. Right Ventricle: The right ventricular size is normal. Right ventricular systolic function is normal. There is normal pulmonary artery systolic pressure. The tricuspid regurgitant velocity is 2.38 m/s, and with an assumed right atrial pressure of 3 mmHg,  the estimated right ventricular systolic pressure is 40.1 mmHg. Left Atrium: Left atrial size was normal in size. Right Atrium: Right atrial size was normal in size. Pericardium: There is no evidence of pericardial effusion. Mitral Valve: The mitral valve is normal in structure. Trivial mitral valve regurgitation. No evidence of mitral valve stenosis. Tricuspid Valve: The tricuspid valve is normal in structure. Tricuspid valve regurgitation is trivial. No evidence of tricuspid stenosis. Aortic Valve: The aortic valve is tricuspid. Aortic valve regurgitation is not visualized. No aortic stenosis is present. Aortic valve mean gradient measures 5.0 mmHg. Aortic valve peak gradient measures 9.7 mmHg. Aortic valve area, by VTI measures 1.96 cm. Pulmonic Valve: The pulmonic valve was not well visualized. Pulmonic valve regurgitation is trivial. No evidence of pulmonic stenosis. Aorta: The aortic root is normal in size and structure. Venous: The inferior vena cava is normal in size with greater than 50% respiratory variability, suggesting right atrial pressure of 3 mmHg. IAS/Shunts: The interatrial septum was not well visualized.  LEFT VENTRICLE PLAX 2D LVIDd:         3.70 cm     Diastology LVIDs:         2.70 cm     LV e' medial:    8.38 cm/s LV PW:         0.90 cm     LV E/e' medial:  10.1 LV IVS:        1.10 cm     LV e' lateral:   8.27 cm/s LVOT diam:     1.70 cm     LV E/e' lateral: 10.3 LV SV:         62 LV SV  Index:   40 LVOT Area:     2.27 cm  LV Volumes (MOD) LV vol d,  MOD A2C: 41.4 ml LV vol d, MOD A4C: 63.6 ml LV vol s, MOD A2C: 16.9 ml LV vol s, MOD A4C: 25.9 ml LV SV MOD A2C:     24.5 ml LV SV MOD A4C:     63.6 ml LV SV MOD BP:      30.5 ml RIGHT VENTRICLE RV Basal diam:  3.30 cm RV Mid diam:    2.70 cm RV S prime:     12.70 cm/s TAPSE (M-mode): 1.6 cm LEFT ATRIUM             Index        RIGHT ATRIUM          Index LA diam:        2.80 cm 1.79 cm/m   RA Area:     7.72 cm LA Vol (A2C):   27.3 ml 17.46 ml/m  RA Volume:   13.00 ml 8.32 ml/m LA Vol (A4C):   35.4 ml 22.65 ml/m LA Biplane Vol: 33.5 ml 21.43 ml/m  AORTIC VALVE                     PULMONIC VALVE AV Area (Vmax):    1.82 cm      PV Vmax:          0.99 m/s AV Area (Vmean):   1.75 cm      PV Peak grad:     3.9 mmHg AV Area (VTI):     1.96 cm      PR End Diast Vel: 2.40 msec AV Vmax:           156.00 cm/s AV Vmean:          103.000 cm/s AV VTI:            0.317 m AV Peak Grad:      9.7 mmHg AV Mean Grad:      5.0 mmHg LVOT Vmax:         125.00 cm/s LVOT Vmean:        79.600 cm/s LVOT VTI:          0.274 m LVOT/AV VTI ratio: 0.86  AORTA Ao Root diam: 2.80 cm Ao Asc diam:  2.70 cm MITRAL VALVE               TRICUSPID VALVE MV Area (PHT): 3.72 cm    TR Peak grad:   22.7 mmHg MV Decel Time: 204 msec    TR Vmax:        238.00 cm/s MR Peak grad: 21.5 mmHg MR Vmax:      232.00 cm/s  SHUNTS MV E velocity: 84.80 cm/s  Systemic VTI:  0.27 m MV A velocity: 79.30 cm/s  Systemic Diam: 1.70 cm MV E/A ratio:  1.07 Kirk Ruths MD Electronically signed by Kirk Ruths MD Signature Date/Time: 09/22/2021/2:23:51 PM    Final    CT ANGIO HEAD W OR WO CONTRAST  Result Date: 09/22/2021 CLINICAL DATA:  Stroke, follow up EXAM: CT ANGIOGRAPHY HEAD AND NECK TECHNIQUE: Multidetector CT imaging of the head and neck was performed using the standard protocol during bolus administration of intravenous contrast. Multiplanar CT image reconstructions and MIPs were obtained to evaluate the vascular anatomy. Carotid stenosis measurements  (when applicable) are obtained utilizing NASCET criteria, using the distal internal carotid diameter as the denominator. RADIATION DOSE REDUCTION: This exam was  performed according to the departmental dose-optimization program which includes automated exposure control, adjustment of the mA and/or kV according to patient size and/or use of iterative reconstruction technique. CONTRAST:  64m OMNIPAQUE IOHEXOL 350 MG/ML SOLN COMPARISON:  None Available. FINDINGS: CT HEAD FINDINGS Brain: Small possible infarcts in the left occipital lobe better characterized on same day MRI. Patchy white matter hypodensities, nonspecific but compatible with chronic microvascular disease. No evidence of acute hemorrhage, mass lesion, midline shift or hydrocephalus. Cerebral atrophy with ex vacuo ventricular dilation. Vascular: See below. Skull: No acute fracture. Sinuses/Orbits: Visualized sinuses are clear. No acute orbital findings. Other: No mastoid effusions. Review of the MIP images confirms the above findings CTA NECK FINDINGS Aortic arch: Great vessel origins are patent. Right carotid system: Retropharyngeal course of the common carotid artery. Common carotid artery and internal carotid artery patent without greater than 50% stenosis. Left carotid system: Retropharyngeal course of the common carotid artery. Common carotid artery and internal carotid artery patent without greater than 50% stenosis. Vertebral arteries: Left dominant. Patent bilaterally without significant (greater than 50%) stenosis. Skeleton: No acute findings. Other neck: Thyroid nodules, measuring up to 8 mm. These do not require further imaging follow-up (ref: J Am Coll Radiol. 2015 Feb;12(2): 143-50). Upper chest: Visualized lung apices are clear. Review of the MIP images confirms the above findings CTA HEAD FINDINGS Anterior circulation: Mild atherosclerosis of bilateral intracranial ICAs which are mildly narrowed. Moderate left and mild right M1 MCA stenosis.  ACAs are patent bilaterally. Posterior circulation: Bilateral intradural vertebral arteries and basilar artery are patent without significant stenosis. Severe stenosis versus occlusion of the left P2 PCA (for example, series 12, image 97) with distal reconstitution. Severe stenosis of the right P1 and P2 PCA. Venous sinuses: As permitted by contrast timing, patent. Review of the MIP images confirms the above findings IMPRESSION: 1. Severe stenosis versus occlusion of the left P2 PCA with distal reconstitution. 2. Severe stenosis of the right P1 and P2 PCA. 3. Moderate left and mild right M1 MCA stenosis. These results will be called to the ordering clinician or representative by the Radiologist Assistant, and communication documented in the PACS or [employer] Electronically Signed   By: FMargaretha SheffieldM.D.   On: 09/22/2021 12:26   CT ANGIO NECK W OR WO CONTRAST  Result Date: 09/22/2021 CLINICAL DATA:  Stroke, follow up EXAM: CT ANGIOGRAPHY HEAD AND NECK TECHNIQUE: Multidetector CT imaging of the head and neck was performed using the standard protocol during bolus administration of intravenous contrast. Multiplanar CT image reconstructions and MIPs were obtained to evaluate the vascular anatomy. Carotid stenosis measurements (when applicable) are obtained utilizing NASCET criteria, using the distal internal carotid diameter as the denominator. RADIATION DOSE REDUCTION: This exam was performed according to the departmental dose-optimization program which includes automated exposure control, adjustment of the mA and/or kV according to patient size and/or use of iterative reconstruction technique. CONTRAST:  721mOMNIPAQUE IOHEXOL 350 MG/ML SOLN COMPARISON:  None Available. FINDINGS: CT HEAD FINDINGS Brain: Small possible infarcts in the left occipital lobe better characterized on same day MRI. Patchy white matter hypodensities, nonspecific but compatible with chronic microvascular disease. No evidence of  acute hemorrhage, mass lesion, midline shift or hydrocephalus. Cerebral atrophy with ex vacuo ventricular dilation. Vascular: See below. Skull: No acute fracture. Sinuses/Orbits: Visualized sinuses are clear. No acute orbital findings. Other: No mastoid effusions. Review of the MIP images confirms the above findings CTA NECK FINDINGS Aortic arch: Great vessel origins are patent. Right carotid  system: Retropharyngeal course of the common carotid artery. Common carotid artery and internal carotid artery patent without greater than 50% stenosis. Left carotid system: Retropharyngeal course of the common carotid artery. Common carotid artery and internal carotid artery patent without greater than 50% stenosis. Vertebral arteries: Left dominant. Patent bilaterally without significant (greater than 50%) stenosis. Skeleton: No acute findings. Other neck: Thyroid nodules, measuring up to 8 mm. These do not require further imaging follow-up (ref: J Am Coll Radiol. 2015 Feb;12(2): 143-50). Upper chest: Visualized lung apices are clear. Review of the MIP images confirms the above findings CTA HEAD FINDINGS Anterior circulation: Mild atherosclerosis of bilateral intracranial ICAs which are mildly narrowed. Moderate left and mild right M1 MCA stenosis. ACAs are patent bilaterally. Posterior circulation: Bilateral intradural vertebral arteries and basilar artery are patent without significant stenosis. Severe stenosis versus occlusion of the left P2 PCA (for example, series 12, image 97) with distal reconstitution. Severe stenosis of the right P1 and P2 PCA. Venous sinuses: As permitted by contrast timing, patent. Review of the MIP images confirms the above findings IMPRESSION: 1. Severe stenosis versus occlusion of the left P2 PCA with distal reconstitution. 2. Severe stenosis of the right P1 and P2 PCA. 3. Moderate left and mild right M1 MCA stenosis. These results will be called to the ordering clinician or representative by  the Radiologist Assistant, and communication documented in the PACS or [employer]. Electronically Signed   By: Margaretha Sheffield M.D.   On: 09/22/2021 12:26   MR BRAIN WO CONTRAST  Result Date: 09/22/2021 CLINICAL DATA:  Dizziness, persistent/recurrent, cardiac or vascular cause suspected EXAM: MRI HEAD WITHOUT CONTRAST TECHNIQUE: Multiplanar, multiecho pulse sequences of the brain and surrounding structures were obtained without intravenous contrast. COMPARISON:  CT head 09/21/2021. FINDINGS: Brain: A few punctate foci of mild DWI hyperintensity in left occipital lobe without clear/definite ADC correlate (for example series 8, images 65/66). No significant edema or mass effect. No acute hemorrhage, hydrocephalus, extra-axial collection or mass lesion. Cerebral atrophy. Patchy T2/FLAIR hyperintensity in the white matter, nonspecific but compatible with chronic microvascular ischemic disease. Vascular: Major arterial flow voids are maintained at the skull base Skull and upper cervical spine: Normal marrow signal. Sinuses/Orbits: Clear sinuses.  No acute orbital findings Other: No mastoid effusions. IMPRESSION: A few punctate foci of mild DWI hyperintensity in left occipital lobe could represent acute or subacute infarcts versus artifact. No significant edema or mass effect. Electronically Signed   By: Margaretha Sheffield M.D.   On: 09/22/2021 09:43        Scheduled Meds:  aspirin EC  81 mg Oral Daily   atorvastatin  80 mg Oral Daily   cyanocobalamin  1,000 mcg Intramuscular Q0600   enoxaparin (LOVENOX) injection  40 mg Subcutaneous Q24H   FLUoxetine  20 mg Oral Daily   sodium chloride flush  3 mL Intravenous Q12H   sodium chloride flush  3 mL Intravenous Q12H   Continuous Infusions:  sodium chloride            Aline August, MD [facility] 09/24/2021, 7:41 AM '

## 2021-09-24 NOTE — Progress Notes (Signed)
Staples removed from head per order.

## 2021-09-25 DIAGNOSIS — I10 Essential (primary) hypertension: Secondary | ICD-10-CM | POA: Diagnosis not present

## 2021-09-25 DIAGNOSIS — G9341 Metabolic encephalopathy: Secondary | ICD-10-CM | POA: Diagnosis not present

## 2021-09-25 DIAGNOSIS — G9340 Encephalopathy, unspecified: Secondary | ICD-10-CM | POA: Diagnosis not present

## 2021-09-25 DIAGNOSIS — I639 Cerebral infarction, unspecified: Secondary | ICD-10-CM | POA: Diagnosis not present

## 2021-09-25 DIAGNOSIS — I6529 Occlusion and stenosis of unspecified carotid artery: Secondary | ICD-10-CM

## 2021-09-25 DIAGNOSIS — E538 Deficiency of other specified B group vitamins: Secondary | ICD-10-CM | POA: Diagnosis not present

## 2021-09-25 NOTE — NC FL2 (Signed)
[place] LEVEL OF CARE SCREENING TOOL     IDENTIFICATION  Patient Name: Cheyenne Andrews Birthdate: December 31, 1933 Sex: female Admission Date (Current Location): 09/21/2021  [facility] and [place] Number:  Herbalist and Address:  [facility],  [place], [place]      Provider Number: [id number]  Attending Physician Name and Address:  Aline August, MD  Relative Name and Phone Number:  Scheryl Marten (daughter)    Current Level of Care: Hospital Recommended Level of Care: [place] Prior Approval Number:    Date Approved/Denied:   PASRR Number: [id number]  Discharge Plan: SNF    Current Diagnoses: Patient Active Problem List   Diagnosis Date Noted   CVA (cerebral vascular accident) ([place]) 09/23/2021   B12 deficiency 09/23/2021   Intracranial carotid stenosis 09/23/2021   Hypokalemia 09/23/2021   Hyperlipidemia 09/23/2021   Acute encephalopathy 09/22/2021   Fall    Faintness    Essential hypertension    Chronic diastolic heart failure ([place])    Syncope 03/24/2014   C2 cervical fracture ([place]) 03/24/2014    Orientation RESPIRATION BLADDER Height & Weight     Self, Time, Situation  Normal Continent Weight: 58.3 kg Height:  '5\' 3"'$  (160 cm)  BEHAVIORAL SYMPTOMS/MOOD NEUROLOGICAL BOWEL NUTRITION STATUS      Continent Diet (regualr, thin fluids)  AMBULATORY STATUS COMMUNICATION OF NEEDS Skin   Limited Assist Verbally Normal                       Personal Care Assistance Level of Assistance  Bathing, Dressing Bathing Assistance: Limited assistance   Dressing Assistance: Limited assistance     Functional Limitations Info  Sight, Hearing, Speech Sight Info: Adequate Hearing Info: Adequate (Uses eye glasses PRN) Speech Info: Adequate    SPECIAL CARE FACTORS FREQUENCY  PT (By licensed PT), OT (By licensed OT)     PT Frequency: 5x per week OT Frequency: 5x per week             Contractures Contractures Info: Not present    Additional Factors Info  Code Status, Allergies Code Status Info: DNR Allergies Info: No known allergies           Current Medications (09/25/2021):  This is the current hospital active medication list Current Facility-Administered Medications  Medication Dose Route Frequency Provider Last Rate Last Admin   0.9 %  sodium chloride infusion  250 mL Intravenous PRN Opyd, Ilene Qua, MD       acetaminophen (TYLENOL) tablet 650 mg  650 mg Oral Q6H PRN Opyd, Ilene Qua, MD       Or   acetaminophen (TYLENOL) suppository 650 mg  650 mg Rectal Q6H PRN Opyd, Ilene Qua, MD       aspirin EC tablet 81 mg  81 mg Oral Daily Alekh, Kshitiz, MD   81 mg at 09/25/21 1252   atorvastatin (LIPITOR) tablet 80 mg  80 mg Oral Daily Rikki Spearing, NP   80 mg at 09/25/21 1252   cyanocobalamin (VITAMIN B12) injection 1,000 mcg  1,000 mcg Intramuscular Q0600 Aline August, MD   1,000 mcg at 09/25/21 0632   enoxaparin (LOVENOX) injection 40 mg  40 mg Subcutaneous Q24H Opyd, Ilene Qua, MD   40 mg at 09/25/21 1253   FLUoxetine (PROZAC) capsule 20 mg  20 mg Oral Daily Aline August, MD   20 mg at 09/25/21 1252   hydrALAZINE (APRESOLINE) tablet 25 mg  25 mg Oral Q6H PRN Opyd, Ilene Qua, MD       senna-docusate (Senokot-S) tablet 1 tablet  1 tablet Oral QHS PRN Opyd, Ilene Qua, MD       sodium chloride flush (NS) 0.9 % injection 3 mL  3 mL Intravenous Q12H Opyd, Ilene Qua, MD   3 mL at 09/25/21 1254   sodium chloride flush (NS) 0.9 % injection 3 mL  3 mL Intravenous Q12H Opyd, Ilene Qua, MD   3 mL at 09/25/21 1254   sodium chloride flush (NS) 0.9 % injection 3 mL  3 mL Intravenous PRN Opyd, Ilene Qua, MD   3 mL at 09/25/21 1254     Discharge Medications: Please see discharge summary for a list of discharge medications.  Relevant Imaging Results:  Relevant Lab Results:   Additional Information SSN: [id number]  Roseanne Kaufman, RN

## 2021-09-25 NOTE — TOC Initial Note (Addendum)
Transition of Care [facility]) - Initial/Assessment Note    Patient Details  Name: Cheyenne Andrews MRN: [id number] Date of Birth: 1933/02/17  Transition of Care [facility]) CM/SW Contact:    Roseanne Kaufman, RN Phone Number: 09/25/2021, 1:34 PM  Clinical Narrative:     Patient faxed out to SNF, awaiting bed offers.   TOC will continue to follow             - 4:40p SNF bed offers presented to patient and daughter. This RNCM will await SNF choice.   TOC will continue to follow. Expected Discharge Plan: Skilled Nursing Facility Barriers to Discharge: Continued Medical Work up   Patient Goals and CMS Choice Patient states their goals for this hospitalization and ongoing recovery are:: goto SNF for rehab      Expected Discharge Plan and Services Expected Discharge Plan: [place] In-house Referral: NA Discharge Planning Services: CM Consult Post Acute Care Choice: [place] Living arrangements for the past 2 months: Single Family Home                 DME Arranged: N/A DME Agency: NA       HH Arranged: NA HH Agency: NA        Prior Living Arrangements/Services Living arrangements for the past 2 months: Single Family Home Lives with:: Self Patient language and need for interpreter reviewed:: Yes Do you feel safe going back to the place where you live?: Yes      Need for Family Participation in Patient Care: Yes (Comment) Care giver support system in place?: Yes (comment)   Criminal Activity/Legal Involvement Pertinent to Current Situation/Hospitalization: No - Comment as needed  Activities of Daily Living      Permission Sought/Granted Permission sought to share information with : Case Manager Permission granted to share information with : Yes, Verbal Permission Granted  Share Information with NAME: Case Manager           Emotional Assessment Appearance:: Appears stated age Attitude/Demeanor/Rapport: Engaged Affect (typically observed):  Accepting Orientation: : Oriented to Self, Oriented to Place, Oriented to  Time, Oriented to Situation Alcohol / Substance Use: Not Applicable Psych Involvement: No (comment)  Admission diagnosis:  Fall, initial encounter [W19.XXXA] Urinary tract infection without hematuria, site unspecified [N39.0] Altered mental status, unspecified altered mental status type [H47.42] Acute metabolic encephalopathy [V95.63] Patient Active Problem List   Diagnosis Date Noted   CVA (cerebral vascular accident) ([place]) 09/23/2021   B12 deficiency 09/23/2021   Intracranial carotid stenosis 09/23/2021   Hypokalemia 09/23/2021   Hyperlipidemia 09/23/2021   Acute encephalopathy 09/22/2021   Fall    Faintness    Essential hypertension    Chronic diastolic heart failure ([place])    Syncope 03/24/2014   C2 cervical fracture ([place]) 03/24/2014   PCP:  Kathyrn Lass, MD Pharmacy:   [id number] - [place], [place] - [address] AT [place] [address] [place] [address] Phone: [phone] Fax: [phone]  [id number] [address]: [phone]: [phone]    Social Determinants of Health (SDOH) Interventions    Readmission Risk Interventions     No data to display

## 2021-09-25 NOTE — Progress Notes (Signed)
PROGRESS NOTE    Cheyenne Andrews  ZDG:[id number] DOB: [birth year] DOA: 09/21/2021 PCP: Kathyrn Lass, MD   Brief Narrative:  88 y.o. female with medical history significant for hypertension, anxiety, and history of C2 fracture, possible undiagnosed dementia presented with dizziness, falls and worsening confusion after recent fall on 09/16/2021; apparently fell again the night of 09/20/2021.  On presentation, skin radiographs of the chest and lumbar spine were negative for acute findings.  CT of the head/cervical spine was negative for acute findings.  She was found to have vitamin B12 deficiency and has been started on supplementation.  MRI of brain showed acute/subacute infarctions versus artifact; neurology evaluated the patient and patient has been started on aspirin and Lipitor.  PT recommended SNF placement.  She is currently stable for SNF discharge.  Assessment & Plan:   Acute metabolic encephalopathy Possible concussion after a fall Possible undiagnosed dementia, worsening -Patient has undiagnosed dementia which is possibly worsening -Mental status has worsened after recent fall on 09/16/2021. -Imaging negative for fractures -Monitor mental status.  Fall precautions.  PT recommending SNF placement.  TOC following.  Possible acute/subacute stroke versus artifact Severe stenosis versus occlusion of the left P2 PCA, severe stenosis right P1 and P2 PCA, moderate left MCA stenosis -MRI of brain showed acute/subacute infarctions versus artifact. -CTA head and neck showed findings as above. -Neurology has evaluated the patient and recommended that patient can stay at [facility] long and does not need to be transferred to [facility].  Currently on aspirin 81 mg daily along with Lipitor 80 mg daily.  Neurology recommended Plavix for 90 days but family refused it.  Outpatient follow-up with neurology. -SLP evaluation. -LDL 155.  A1c 5. -2D echo showed EF of 60 to 65%  Vitamin B12  deficiency -Vitamin B12 level 69.  Continue parenteral supplementation while in the hospital and oral supplementation on discharge.  Goals of care -Discussed with daughter at bedside who confirms DNR status.  Change CODE STATUS to DNR. -Recommend outpatient palliative care evaluation and follow-up  Hypokalemia -Improved   hypertension -monitor blood pressure.  Not on antihypertensives as an outpatient  Hyperlipidemia -Lipitor as above  DVT prophylaxis: Lovenox Code Status: DNR.  Family Communication: Daughter at bedside Disposition Plan: Status is: Observation The patient will require care spanning > 2 midnights and should be moved to inpatient because: Will need SNF placement  Consultants: Neurology  Procedures: 2D echo  Antimicrobials: None   Subjective: Patient seen and examined at bedside.  Awake but still pleasantly confused. Daughter at bedside.  No seizures, agitation, fever or vomiting reported.. Objective: Vitals:   09/24/21 1327 09/24/21 2058 09/25/21 0424 09/25/21 0500  BP: (!) 117/50 137/66 (!) 156/85   Pulse: 64 61 (!) 59   Resp: '18 17 19   '$ Temp: 98.4 F (36.9 C) 98.3 F (36.8 C) 97.7 F (36.5 C)   TempSrc: Oral Oral Oral   SpO2: 91% 96% 95%   Weight:    58.3 kg  Height:        Intake/Output Summary (Last 24 hours) at 09/25/2021 1019 Last data filed at 09/25/2021 0210 Gross per 24 hour  Intake 120 ml  Output 700 ml  Net -580 ml    Filed Weights   09/21/21 1952 09/23/21 0500 09/25/21 0500  Weight: 55 kg 54.2 kg 58.3 kg    Examination:  General exam: On room air currently.  No acute distress.  Elderly female lying in bed.  Awake, pleasantly confused, participating in conversation more today.  Poor historian. Respiratory system: Bilateral decreased breath sounds at bases cardiovascular system: S1-S2 heard; mild bradycardia present gastrointestinal system: Abdomen is mildly distended; soft and nontender.  Normal bowel sounds heard  extremities:  No cyanosis; trace lower extremity edema present  Data Reviewed: I have personally reviewed following labs and imaging studies  CBC: Recent Labs  Lab 09/21/21 2101 09/22/21 0349 09/23/21 0432  WBC 6.1 5.2 4.5  NEUTROABS 3.9  --   --   HGB 14.2 13.7 13.9  HCT 40.9 39.9 40.8  MCV 93.4 95.2 96.0  PLT 200 187 950    Basic Metabolic Panel: Recent Labs  Lab 09/21/21 2101 09/22/21 0349 09/23/21 0432  NA 137 139 140  K 3.7 3.4* 3.6  CL 104 105 107  CO2 '25 25 26  '$ GLUCOSE 94 88 93  BUN '13 12 9  '$ CREATININE 0.88 0.74 0.69  CALCIUM 8.4* 8.3* 8.3*  MG  --  2.0 2.3  PHOS  --  1.3*  --     GFR: Estimated Creatinine Clearance: 41.8 mL/min (by C-G formula based on SCr of 0.69 mg/dL). Liver Function Tests: Recent Labs  Lab 09/22/21 0349  AST 17  ALT 10  ALKPHOS 72  BILITOT 0.8  PROT 6.4*  ALBUMIN 3.4*    No results for input(s): "LIPASE", "AMYLASE" in the last 168 hours. Recent Labs  Lab 09/22/21 0349  AMMONIA 25    Coagulation Profile: No results for input(s): "INR", "PROTIME" in the last 168 hours. Cardiac Enzymes: No results for input(s): "CKTOTAL", "CKMB", "CKMBINDEX", "TROPONINI" in the last 168 hours. BNP (last 3 results) No results for input(s): "PROBNP" in the last 8760 hours. HbA1C: No results for input(s): "HGBA1C" in the last 72 hours.  CBG: No results for input(s): "GLUCAP" in the last 168 hours. Lipid Profile: No results for input(s): "CHOL", "HDL", "LDLCALC", "TRIG", "CHOLHDL", "LDLDIRECT" in the last 72 hours.  Thyroid Function Tests: No results for input(s): "TSH", "T4TOTAL", "FREET4", "T3FREE", "THYROIDAB" in the last 72 hours.  Anemia Panel: No results for input(s): "VITAMINB12", "FOLATE", "FERRITIN", "TIBC", "IRON", "RETICCTPCT" in the last 72 hours.  Sepsis Labs: No results for input(s): "PROCALCITON", "LATICACIDVEN" in the last 168 hours.  Recent Results (from the past 240 hour(s))  Urine Culture     Status: None   Collection Time:  09/22/21 12:56 PM   Specimen: Urine, Clean Catch  Result Value Ref Range Status   Specimen Description   Final    URINE, CLEAN CATCH Performed at [facility], [place] [address] [place], [place] [address]    Special Requests   Final    NONE Performed at [facility], [place] [address] [place], [place] [address]    Culture   Final    NO GROWTH Performed at [facility], [place] [address] [place], [place] [address]    Report Status 09/23/2021 FINAL  Final         Radiology Studies: No results found.      Scheduled Meds:  aspirin EC  81 mg Oral Daily   atorvastatin  80 mg Oral Daily   cyanocobalamin  1,000 mcg Intramuscular Q0600   enoxaparin (LOVENOX) injection  40 mg Subcutaneous Q24H   FLUoxetine  20 mg Oral Daily   sodium chloride flush  3 mL Intravenous Q12H   sodium chloride flush  3 mL Intravenous Q12H   Continuous Infusions:  sodium chloride            Aline August, MD [facility] 09/25/2021, 10:19 AM '

## 2021-09-25 NOTE — Progress Notes (Signed)
Physical Therapy Treatment Patient Details Name: Vanilla Heatherington MRN: [id number] DOB: Jul 20, 1933 Today's Date: 09/25/2021   History of Present Illness 88 yo female  who presented to the ED with dizziness, falls, and confusion  admitted with acute encephalopathy, multiple falls most recent resulting in  head lac. ELF:[id number], anxiety, and history of C2 fracture  MRI =punctate foci of mild DWI hyperintensity in left occipital  lobe could represent acute or subacute infarcts versus artifact. No  significant edema or mass effect.    PT Comments    Pt agreeable to mobilize however wished to remain in room.  Pt also performed a couple standing exercises.  Pt reports some mild back pain (had a few falls at home prior to admission) so educated pt on log roll technique and cues provided for decreasing back pain with transfers as well.  Pt encouraged to remain OOB in recliner end of session.    Recommendations for follow up therapy are one component of a multi-disciplinary discharge planning process, led by the attending physician.  Recommendations may be updated based on patient status, additional functional criteria and insurance authorization.  Follow Up Recommendations  Skilled nursing-short term rehab (<3 hours/day) Can patient physically be transported by private vehicle: No   Assistance Recommended at Discharge Intermittent Supervision/Assistance  Patient can return home with the following A little help with walking and/or transfers;A little help with bathing/dressing/bathroom;Assistance with cooking/housework;Assist for transportation;Direct supervision/assist for financial management;Help with stairs or ramp for entrance;Direct supervision/assist for medications management   Equipment Recommendations  None recommended by PT    Recommendations for Other Services       Precautions / Restrictions Precautions Precautions: Fall Restrictions Weight Bearing Restrictions: No      Mobility  Bed Mobility Overal bed mobility: Needs Assistance Bed Mobility: Rolling, Sidelying to Sit Rolling: Min assist Sidelying to sit: Mod assist       General bed mobility comments: multimodal cues for log roll technique as pt reports back pain after falls at home    Transfers Overall transfer level: Needs assistance Equipment used: Rolling walker (2 wheels) Transfers: Sit to/from Stand Sit to Stand: Min assist           General transfer comment: light assist to rise and steady, cues for technique    Ambulation/Gait Ambulation/Gait assistance: Min assist, Min guard Gait Distance (Feet): 40 Feet Assistive device: Rolling walker (2 wheels) Gait Pattern/deviations: Step-through pattern, Decreased stride length, Trunk flexed Gait velocity: decr     General Gait Details: assist for maneuvering RW, pt only wanted to ambulate in room so performed 40'x1 then 20'x1, cues for safe use of RW   Stairs             Wheelchair Mobility    Modified Rankin (Stroke Patients Only)       Balance Overall balance assessment: History of Falls                                          Cognition Arousal/Alertness: Awake/alert Behavior During Therapy: WFL for tasks assessed/performed Overall Cognitive Status: Impaired/Different from baseline                       Memory: Decreased short-term memory       Problem Solving: Requires verbal cues, Slow processing General Comments: pt asking same questions        Exercises General  Exercises - Lower Extremity Hip Flexion/Marching: AROM, Standing, 10 reps, Both (standing exercises performed with UE support) Heel Raises: AROM, Both, Standing, 10 reps Mini-Sqauts: AROM, Both, 10 reps, Standing    General Comments        Pertinent Vitals/Pain Pain Assessment Pain Assessment: Faces Faces Pain Scale: Hurts little more Pain Location: Low back Pain Descriptors / Indicators: Aching,  Grimacing Pain Intervention(s): Repositioned, Monitored during session    Home Living                          Prior Function            PT Goals (current goals can now be found in the care plan section) Progress towards PT goals: Progressing toward goals    Frequency    Min 2X/week      PT Plan Current plan remains appropriate    Co-evaluation              AM-PAC PT "6 Clicks" Mobility   Outcome Measure  Help needed turning from your back to your side while in a flat bed without using bedrails?: A Little Help needed moving from lying on your back to sitting on the side of a flat bed without using bedrails?: A Little Help needed moving to and from a bed to a chair (including a wheelchair)?: A Little Help needed standing up from a chair using your arms (e.g., wheelchair or bedside chair)?: A Little Help needed to walk in hospital room?: A Lot Help needed climbing 3-5 steps with a railing? : A Lot 6 Click Score: 16    End of Session Equipment Utilized During Treatment: Gait belt Activity Tolerance: Patient tolerated treatment well Patient left: in chair;with call bell/phone within reach;with family/visitor present;with chair alarm set   PT Visit Diagnosis: Difficulty in walking, not elsewhere classified (R26.2)     Time: 1050-1106 PT Time Calculation (min) (ACUTE ONLY): 16 min  Charges:  $Gait Training: 8-22 mins                     Jannette Spanner PT, DPT Physical Therapist Acute Rehabilitation Services Preferred contact method: Secure Chat Weekend Pager Only: [phone] Office: [phone]    Myrtis Hopping [place] 09/25/2021, 1:09 PM

## 2021-09-26 DIAGNOSIS — I639 Cerebral infarction, unspecified: Secondary | ICD-10-CM | POA: Diagnosis not present

## 2021-09-26 DIAGNOSIS — F419 Anxiety disorder, unspecified: Secondary | ICD-10-CM | POA: Diagnosis not present

## 2021-09-26 DIAGNOSIS — Z7409 Other reduced mobility: Secondary | ICD-10-CM | POA: Diagnosis not present

## 2021-09-26 DIAGNOSIS — I69398 Other sequelae of cerebral infarction: Secondary | ICD-10-CM | POA: Diagnosis not present

## 2021-09-26 DIAGNOSIS — M6281 Muscle weakness (generalized): Secondary | ICD-10-CM | POA: Diagnosis not present

## 2021-09-26 DIAGNOSIS — D519 Vitamin B12 deficiency anemia, unspecified: Secondary | ICD-10-CM | POA: Diagnosis not present

## 2021-09-26 DIAGNOSIS — G9341 Metabolic encephalopathy: Secondary | ICD-10-CM | POA: Diagnosis not present

## 2021-09-26 DIAGNOSIS — E785 Hyperlipidemia, unspecified: Secondary | ICD-10-CM | POA: Diagnosis not present

## 2021-09-26 DIAGNOSIS — F32A Depression, unspecified: Secondary | ICD-10-CM | POA: Diagnosis not present

## 2021-09-26 DIAGNOSIS — Z743 Need for continuous supervision: Secondary | ICD-10-CM | POA: Diagnosis not present

## 2021-09-26 DIAGNOSIS — G9340 Encephalopathy, unspecified: Secondary | ICD-10-CM | POA: Diagnosis not present

## 2021-09-26 DIAGNOSIS — I5032 Chronic diastolic (congestive) heart failure: Secondary | ICD-10-CM | POA: Diagnosis not present

## 2021-09-26 DIAGNOSIS — I69328 Other speech and language deficits following cerebral infarction: Secondary | ICD-10-CM | POA: Diagnosis not present

## 2021-09-26 DIAGNOSIS — F0390 Unspecified dementia without behavioral disturbance: Secondary | ICD-10-CM | POA: Diagnosis not present

## 2021-09-26 DIAGNOSIS — E538 Deficiency of other specified B group vitamins: Secondary | ICD-10-CM | POA: Diagnosis not present

## 2021-09-26 DIAGNOSIS — R41841 Cognitive communication deficit: Secondary | ICD-10-CM | POA: Diagnosis not present

## 2021-09-26 DIAGNOSIS — R42 Dizziness and giddiness: Secondary | ICD-10-CM | POA: Diagnosis not present

## 2021-09-26 DIAGNOSIS — R262 Difficulty in walking, not elsewhere classified: Secondary | ICD-10-CM | POA: Diagnosis not present

## 2021-09-26 DIAGNOSIS — R2681 Unsteadiness on feet: Secondary | ICD-10-CM | POA: Diagnosis not present

## 2021-09-26 DIAGNOSIS — Z9181 History of falling: Secondary | ICD-10-CM | POA: Diagnosis not present

## 2021-09-26 DIAGNOSIS — R2689 Other abnormalities of gait and mobility: Secondary | ICD-10-CM | POA: Diagnosis not present

## 2021-09-26 DIAGNOSIS — E539 Vitamin B deficiency, unspecified: Secondary | ICD-10-CM | POA: Diagnosis not present

## 2021-09-26 DIAGNOSIS — Z7982 Long term (current) use of aspirin: Secondary | ICD-10-CM | POA: Diagnosis not present

## 2021-09-26 DIAGNOSIS — I10 Essential (primary) hypertension: Secondary | ICD-10-CM | POA: Diagnosis not present

## 2021-09-26 DIAGNOSIS — N390 Urinary tract infection, site not specified: Secondary | ICD-10-CM | POA: Diagnosis not present

## 2021-09-26 DIAGNOSIS — R279 Unspecified lack of coordination: Secondary | ICD-10-CM | POA: Diagnosis not present

## 2021-09-26 DIAGNOSIS — I6529 Occlusion and stenosis of unspecified carotid artery: Secondary | ICD-10-CM | POA: Diagnosis not present

## 2021-09-26 DIAGNOSIS — R4182 Altered mental status, unspecified: Secondary | ICD-10-CM | POA: Diagnosis not present

## 2021-09-26 DIAGNOSIS — M545 Low back pain, unspecified: Secondary | ICD-10-CM | POA: Diagnosis not present

## 2021-09-26 LAB — VITAMIN B1: Vitamin B1 (Thiamine): 100.1 nmol/L (ref 66.5–200.0)

## 2021-09-26 MED ORDER — VITAMIN B-12 1000 MCG PO TABS: 1000.0000 ug | ORAL_TABLET | Freq: Every day | ORAL | 0 refills | Status: AC

## 2021-09-26 MED ORDER — ATORVASTATIN CALCIUM 80 MG PO TABS: 80.0000 mg | ORAL_TABLET | Freq: Every day | ORAL | 0 refills | Status: DC

## 2021-09-26 NOTE — Progress Notes (Signed)
RN attempted handoff report to receiving facility. Initial phone call received, but when phone was transferred, phone rang multiple times with no answer.   RN will attempt calling again later.

## 2021-09-26 NOTE — TOC Transition Note (Addendum)
Transition of Care [facility]) - CM/SW Discharge Note   Patient Details  Name: Cheyenne Andrews MRN: [id number] Date of Birth: Sep 26, 1933  [facility] Care [facility]) CM/SW Contact:  Roseanne Kaufman, RN Phone Number: 09/26/2021, 9:18 AM   Clinical Narrative: Patient and family selected Cheyenne Andrews for SNF placement. Spoke with Cheyenne Andrews with [facility] bed will be available on 09/28/21, will hold the bed. This RNCM reached out to [facility] regarding their pending response, awaiting response. Notified MD, RN, patient's daughter.  TOC will continue to follow.    - 11:35 am: This RNCM spoke with Cheyenne Andrews with [facility] to confirm patient is [facility] and [facility] waiver will apply. Patient and family has selected [facility]. Patient will transport via [facility] today.   TOC will continue to follow.   - 11:54: Report can be called to [facility] ph# [phone], room#509. MD, RN and family notified. Patient transport via [facility], folder will be at nursing secretary desk.   - 12:05p [facility] called, MR, RN notified TOC will continue to follow.   Barriers to Discharge: Continued Medical Work up   Patient Goals and CMS Choice Patient states their goals for this hospitalization and ongoing recovery are:: goto SNF for rehab      Discharge Placement                       Discharge Plan and Services In-house Referral: NA Discharge Planning Services: CM Consult Post Acute Care Choice: Skilled Nursing Facility          DME Arranged: N/A DME Agency: NA       HH Arranged: NA HH Agency: NA        Social Determinants of Health (SDOH) Interventions     Readmission Risk Interventions     No data to display

## 2021-09-26 NOTE — Discharge Summary (Signed)
Physician Discharge Summary  Cheyenne Andrews WER:[id number] DOB: 1934-01-13 DOA: 09/21/2021  PCP: Kathyrn Lass, MD  Admit date: 09/21/2021 Discharge date: 09/26/2021  Admitted From: Home Disposition: SNF  Recommendations for Outpatient Follow-up:  Follow up with SNF provider at earliest convenience Outpatient follow up with neurology Follow up in ED if symptoms worsen or new appear   Home Health: No Equipment/Devices: None  Discharge Condition: Guarded CODE STATUS: DNR Diet recommendation: Heart healthy  Brief/Interim Summary: 88 y.o. female with medical history significant for hypertension, anxiety, and history of C2 fracture, possible undiagnosed dementia presented with dizziness, falls and worsening confusion after recent fall on 09/16/2021; apparently fell again the night of 09/20/2021.  On presentation, skin radiographs of the chest and lumbar spine were negative for acute findings.  CT of the head/cervical spine was negative for acute findings.  She was found to have vitamin B12 deficiency and has been started on supplementation.  MRI of brain showed acute/subacute infarctions versus artifact; neurology evaluated the patient and patient has been started on aspirin and Lipitor.  PT recommended SNF placement.  She is currently stable for SNF discharge. Discharge to SNF once bed is available.  Discharge Diagnoses:   Acute metabolic encephalopathy Possible concussion after a fall Possible undiagnosed dementia, worsening -Patient has undiagnosed dementia which is possibly worsening -Mental status has worsened after recent fall on 09/16/2021. -Imaging negative for fractures -Monitor mental status.  Fall precautions.  PT recommending SNF placement.   -She is currently stable for SNF discharge. Discharge to SNF once bed is available. -Mental status improving and currently probably back to her baseline   Possible acute/subacute stroke versus artifact Severe stenosis versus occlusion of the  left P2 PCA, severe stenosis right P1 and P2 PCA, moderate left MCA stenosis -MRI of brain showed acute/subacute infarctions versus artifact. -CTA head and neck showed findings as above. -Neurology has evaluated the patient and recommended that patient can stay at [facility] long and does not need to be transferred to [facility].  Currently on aspirin 81 mg daily along with Lipitor 80 mg daily.  Neurology recommended Plavix for 90 days but family refused it.  Outpatient follow-up with neurology. -SLP evaluation. -LDL 155.  A1c 5. -2D echo showed EF of 60 to 65%   Vitamin B12 deficiency -Vitamin B12 level 69.  Treated with parenteral supplementation in the hospital and continue oral supplementation on discharge.   Goals of care -Discussed with daughter at bedside who confirms DNR status.  Changed CODE STATUS to DNR. -Recommend outpatient palliative care evaluation and follow-up   Hypokalemia -Improved    hypertension -monitor blood pressure.  Not on antihypertensives as an outpatient. Outpatient follow up   Hyperlipidemia -Lipitor as above  Discharge Instructions  Discharge Instructions     Ambulatory referral to Neurology   Complete by: As directed    An appointment is requested in approximately: 2 weeks   Diet - low sodium heart healthy   Complete by: As directed    Increase activity slowly   Complete by: As directed    No wound care   Complete by: As directed       Allergies as of 09/26/2021   No Known Allergies      Medication List     TAKE these medications    aspirin EC 81 MG tablet Take 1 tablet (81 mg total) by mouth daily.   atorvastatin 80 MG tablet Commonly known as: LIPITOR Take 1 tablet (80 mg total) by mouth daily.  cyanocobalamin 1000 MCG tablet Commonly known as: VITAMIN B12 Take 1 tablet (1,000 mcg total) by mouth daily.   GENTEAL MILD OP Apply to eye as needed (for eye dryness).   ibandronate 150 MG tablet Commonly known as:  BONIVA Take 150 mg by mouth every 30 (thirty) days. Take in the morning with a full glass of water, on an empty stomach, and do not take anything else by mouth or lie down for the next 30 min.   PROzac 20 MG capsule Generic drug: FLUoxetine Take 20 mg by mouth daily.        No Known Allergies  Consultations: Neurology   Procedures/Studies: ECHOCARDIOGRAM COMPLETE  Result Date: 09/22/2021    ECHOCARDIOGRAM REPORT   Patient Name:   Cheyenne Andrews Date of Exam: 09/22/2021 Medical Rec #:  [id number]        Height:       63.0 in Accession #:    [id number]       Weight:       121.3 lb Date of Birth:  03-26-1933        BSA:          1.563 m Patient Age:    76 years         BP:           150/80 mmHg Patient Gender: F                HR:           61 bpm. Exam Location:  Inpatient Procedure: 2D Echo Indications:    stroke  History:        Patient has prior history of Echocardiogram examinations, most                 recent 03/26/2014. CHF, Signs/Symptoms:Syncope; Risk                 Factors:Hypertension.  Sonographer:    Harvie Junior Referring Phys: [id number] [facility]  Sonographer Comments: Technically difficult study due to poor echo windows. Image acquisition challenging due to respiratory motion and patient supine. IMPRESSIONS  1. Left ventricular ejection fraction, by estimation, is 60 to 65%. The left ventricle has normal function. The left ventricle has no regional wall motion abnormalities. Left ventricular diastolic parameters were normal.  2. Right ventricular systolic function is normal. The right ventricular size is normal. There is normal pulmonary artery systolic pressure.  3. The mitral valve is normal in structure. Trivial mitral valve regurgitation. No evidence of mitral stenosis.  4. The aortic valve is tricuspid. Aortic valve regurgitation is not visualized. No aortic stenosis is present.  5. The inferior vena cava is normal in size with greater than 50% respiratory variability,  suggesting right atrial pressure of 3 mmHg. Comparison(s): No prior Echocardiogram. FINDINGS  Left Ventricle: Left ventricular ejection fraction, by estimation, is 60 to 65%. The left ventricle has normal function. The left ventricle has no regional wall motion abnormalities. The left ventricular internal cavity size was normal in size. There is  no left ventricular hypertrophy. Left ventricular diastolic parameters were normal. Right Ventricle: The right ventricular size is normal. Right ventricular systolic function is normal. There is normal pulmonary artery systolic pressure. The tricuspid regurgitant velocity is 2.38 m/s, and with an assumed right atrial pressure of 3 mmHg,  the estimated right ventricular systolic pressure is 98.3 mmHg. Left Atrium: Left atrial size was normal in size. Right Atrium: Right atrial size was normal in size. Pericardium: There is  no evidence of pericardial effusion. Mitral Valve: The mitral valve is normal in structure. Trivial mitral valve regurgitation. No evidence of mitral valve stenosis. Tricuspid Valve: The tricuspid valve is normal in structure. Tricuspid valve regurgitation is trivial. No evidence of tricuspid stenosis. Aortic Valve: The aortic valve is tricuspid. Aortic valve regurgitation is not visualized. No aortic stenosis is present. Aortic valve mean gradient measures 5.0 mmHg. Aortic valve peak gradient measures 9.7 mmHg. Aortic valve area, by VTI measures 1.96 cm. Pulmonic Valve: The pulmonic valve was not well visualized. Pulmonic valve regurgitation is trivial. No evidence of pulmonic stenosis. Aorta: The aortic root is normal in size and structure. Venous: The inferior vena cava is normal in size with greater than 50% respiratory variability, suggesting right atrial pressure of 3 mmHg. IAS/Shunts: The interatrial septum was not well visualized.  LEFT VENTRICLE PLAX 2D LVIDd:         3.70 cm     Diastology LVIDs:         2.70 cm     LV e' medial:    8.38 cm/s LV  PW:         0.90 cm     LV E/e' medial:  10.1 LV IVS:        1.10 cm     LV e' lateral:   8.27 cm/s LVOT diam:     1.70 cm     LV E/e' lateral: 10.3 LV SV:         62 LV SV Index:   40 LVOT Area:     2.27 cm  LV Volumes (MOD) LV vol d, MOD A2C: 41.4 ml LV vol d, MOD A4C: 63.6 ml LV vol s, MOD A2C: 16.9 ml LV vol s, MOD A4C: 25.9 ml LV SV MOD A2C:     24.5 ml LV SV MOD A4C:     63.6 ml LV SV MOD BP:      30.5 ml RIGHT VENTRICLE RV Basal diam:  3.30 cm RV Mid diam:    2.70 cm RV S prime:     12.70 cm/s TAPSE (M-mode): 1.6 cm LEFT ATRIUM             Index        RIGHT ATRIUM          Index LA diam:        2.80 cm 1.79 cm/m   RA Area:     7.72 cm LA Vol (A2C):   27.3 ml 17.46 ml/m  RA Volume:   13.00 ml 8.32 ml/m LA Vol (A4C):   35.4 ml 22.65 ml/m LA Biplane Vol: 33.5 ml 21.43 ml/m  AORTIC VALVE                     PULMONIC VALVE AV Area (Vmax):    1.82 cm      PV Vmax:          0.99 m/s AV Area (Vmean):   1.75 cm      PV Peak grad:     3.9 mmHg AV Area (VTI):     1.96 cm      PR End Diast Vel: 2.40 msec AV Vmax:           156.00 cm/s AV Vmean:          103.000 cm/s AV VTI:            0.317 m AV Peak Grad:      9.7 mmHg AV Mean  Grad:      5.0 mmHg LVOT Vmax:         125.00 cm/s LVOT Vmean:        79.600 cm/s LVOT VTI:          0.274 m LVOT/AV VTI ratio: 0.86  AORTA Ao Root diam: 2.80 cm Ao Asc diam:  2.70 cm MITRAL VALVE               TRICUSPID VALVE MV Area (PHT): 3.72 cm    TR Peak grad:   22.7 mmHg MV Decel Time: 204 msec    TR Vmax:        238.00 cm/s MR Peak grad: 21.5 mmHg MR Vmax:      232.00 cm/s  SHUNTS MV E velocity: 84.80 cm/s  Systemic VTI:  0.27 m MV A velocity: 79.30 cm/s  Systemic Diam: 1.70 cm MV E/A ratio:  1.07 Kirk Ruths MD Electronically signed by Kirk Ruths MD Signature Date/Time: 09/22/2021/2:23:51 PM    Final    CT ANGIO HEAD W OR WO CONTRAST  Result Date: 09/22/2021 CLINICAL DATA:  Stroke, follow up EXAM: CT ANGIOGRAPHY HEAD AND NECK TECHNIQUE: Multidetector CT imaging of the  head and neck was performed using the standard protocol during bolus administration of intravenous contrast. Multiplanar CT image reconstructions and MIPs were obtained to evaluate the vascular anatomy. Carotid stenosis measurements (when applicable) are obtained utilizing NASCET criteria, using the distal internal carotid diameter as the denominator. RADIATION DOSE REDUCTION: This exam was performed according to the departmental dose-optimization program which includes automated exposure control, adjustment of the mA and/or kV according to patient size and/or use of iterative reconstruction technique. CONTRAST:  5m OMNIPAQUE IOHEXOL 350 MG/ML SOLN COMPARISON:  None Available. FINDINGS: CT HEAD FINDINGS Brain: Small possible infarcts in the left occipital lobe better characterized on same day MRI. Patchy white matter hypodensities, nonspecific but compatible with chronic microvascular disease. No evidence of acute hemorrhage, mass lesion, midline shift or hydrocephalus. Cerebral atrophy with ex vacuo ventricular dilation. Vascular: See below. Skull: No acute fracture. Sinuses/Orbits: Visualized sinuses are clear. No acute orbital findings. Other: No mastoid effusions. Review of the MIP images confirms the above findings CTA NECK FINDINGS Aortic arch: Great vessel origins are patent. Right carotid system: Retropharyngeal course of the common carotid artery. Common carotid artery and internal carotid artery patent without greater than 50% stenosis. Left carotid system: Retropharyngeal course of the common carotid artery. Common carotid artery and internal carotid artery patent without greater than 50% stenosis. Vertebral arteries: Left dominant. Patent bilaterally without significant (greater than 50%) stenosis. Skeleton: No acute findings. Other neck: Thyroid nodules, measuring up to 8 mm. These do not require further imaging follow-up (ref: J Am Coll Radiol. 2015 Feb;12(2): 143-50). Upper chest: Visualized lung  apices are clear. Review of the MIP images confirms the above findings CTA HEAD FINDINGS Anterior circulation: Mild atherosclerosis of bilateral intracranial ICAs which are mildly narrowed. Moderate left and mild right M1 MCA stenosis. ACAs are patent bilaterally. Posterior circulation: Bilateral intradural vertebral arteries and basilar artery are patent without significant stenosis. Severe stenosis versus occlusion of the left P2 PCA (for example, series 12, image 97) with distal reconstitution. Severe stenosis of the right P1 and P2 PCA. Venous sinuses: As permitted by contrast timing, patent. Review of the MIP images confirms the above findings IMPRESSION: 1. Severe stenosis versus occlusion of the left P2 PCA with distal reconstitution. 2. Severe stenosis of the right P1 and P2 PCA. 3. Moderate left  and mild right M1 MCA stenosis. These results will be called to the ordering clinician or representative by the Radiologist Assistant, and communication documented in the PACS or [employer]. Electronically Signed   By: Margaretha Sheffield M.D.   On: 09/22/2021 12:26   CT ANGIO NECK W OR WO CONTRAST  Result Date: 09/22/2021 CLINICAL DATA:  Stroke, follow up EXAM: CT ANGIOGRAPHY HEAD AND NECK TECHNIQUE: Multidetector CT imaging of the head and neck was performed using the standard protocol during bolus administration of intravenous contrast. Multiplanar CT image reconstructions and MIPs were obtained to evaluate the vascular anatomy. Carotid stenosis measurements (when applicable) are obtained utilizing NASCET criteria, using the distal internal carotid diameter as the denominator. RADIATION DOSE REDUCTION: This exam was performed according to the departmental dose-optimization program which includes automated exposure control, adjustment of the mA and/or kV according to patient size and/or use of iterative reconstruction technique. CONTRAST:  27m OMNIPAQUE IOHEXOL 350 MG/ML SOLN COMPARISON:  None Available.  FINDINGS: CT HEAD FINDINGS Brain: Small possible infarcts in the left occipital lobe better characterized on same day MRI. Patchy white matter hypodensities, nonspecific but compatible with chronic microvascular disease. No evidence of acute hemorrhage, mass lesion, midline shift or hydrocephalus. Cerebral atrophy with ex vacuo ventricular dilation. Vascular: See below. Skull: No acute fracture. Sinuses/Orbits: Visualized sinuses are clear. No acute orbital findings. Other: No mastoid effusions. Review of the MIP images confirms the above findings CTA NECK FINDINGS Aortic arch: Great vessel origins are patent. Right carotid system: Retropharyngeal course of the common carotid artery. Common carotid artery and internal carotid artery patent without greater than 50% stenosis. Left carotid system: Retropharyngeal course of the common carotid artery. Common carotid artery and internal carotid artery patent without greater than 50% stenosis. Vertebral arteries: Left dominant. Patent bilaterally without significant (greater than 50%) stenosis. Skeleton: No acute findings. Other neck: Thyroid nodules, measuring up to 8 mm. These do not require further imaging follow-up (ref: J Am Coll Radiol. 2015 Feb;12(2): 143-50). Upper chest: Visualized lung apices are clear. Review of the MIP images confirms the above findings CTA HEAD FINDINGS Anterior circulation: Mild atherosclerosis of bilateral intracranial ICAs which are mildly narrowed. Moderate left and mild right M1 MCA stenosis. ACAs are patent bilaterally. Posterior circulation: Bilateral intradural vertebral arteries and basilar artery are patent without significant stenosis. Severe stenosis versus occlusion of the left P2 PCA (for example, series 12, image 97) with distal reconstitution. Severe stenosis of the right P1 and P2 PCA. Venous sinuses: As permitted by contrast timing, patent. Review of the MIP images confirms the above findings IMPRESSION: 1. Severe stenosis  versus occlusion of the left P2 PCA with distal reconstitution. 2. Severe stenosis of the right P1 and P2 PCA. 3. Moderate left and mild right M1 MCA stenosis. These results will be called to the ordering clinician or representative by the Radiologist Assistant, and communication documented in the PACS or [employer] Electronically Signed   By: FMargaretha SheffieldM.D.   On: 09/22/2021 12:26   MR BRAIN WO CONTRAST  Result Date: 09/22/2021 CLINICAL DATA:  Dizziness, persistent/recurrent, cardiac or vascular cause suspected EXAM: MRI HEAD WITHOUT CONTRAST TECHNIQUE: Multiplanar, multiecho pulse sequences of the brain and surrounding structures were obtained without intravenous contrast. COMPARISON:  CT head 09/21/2021. FINDINGS: Brain: A few punctate foci of mild DWI hyperintensity in left occipital lobe without clear/definite ADC correlate (for example series 8, images 65/66). No significant edema or mass effect. No acute hemorrhage, hydrocephalus, extra-axial collection or mass lesion.  Cerebral atrophy. Patchy T2/FLAIR hyperintensity in the white matter, nonspecific but compatible with chronic microvascular ischemic disease. Vascular: Major arterial flow voids are maintained at the skull base Skull and upper cervical spine: Normal marrow signal. Sinuses/Orbits: Clear sinuses.  No acute orbital findings Other: No mastoid effusions. IMPRESSION: A few punctate foci of mild DWI hyperintensity in left occipital lobe could represent acute or subacute infarcts versus artifact. No significant edema or mass effect. Electronically Signed   By: Margaretha Sheffield M.D.   On: 09/22/2021 09:43   CT Cervical Spine Wo Contrast  Result Date: 09/22/2021 CLINICAL DATA:  Fall EXAM: CT CERVICAL SPINE WITHOUT CONTRAST TECHNIQUE: Multidetector CT imaging of the cervical spine was performed without intravenous contrast. Multiplanar CT image reconstructions were also generated. RADIATION DOSE REDUCTION: This exam was performed  according to the departmental dose-optimization program which includes automated exposure control, adjustment of the mA and/or kV according to patient size and/or use of iterative reconstruction technique. COMPARISON:  09/17/2021 FINDINGS: Alignment: No traumatic listhesis. Unchanged trace anterolisthesis of C4 on C5, which appears facet mediated. Skull base and vertebrae: No acute fracture or suspicious osseous lesion. Soft tissues and spinal canal: Negative. Disc levels: Multilevel degenerative changes without high-grade spinal canal stenosis Upper chest: Apical pleural-parenchymal scarring. No focal pulmonary opacity or pleural effusion. Other: None. IMPRESSION: No acute fracture or traumatic listhesis in the cervical spine. Electronically Signed   By: Merilyn Baba M.D.   On: 09/22/2021 00:05   DG Chest Portable 1 View  Result Date: 09/21/2021 CLINICAL DATA:  Fall laceration EXAM: PORTABLE CHEST 1 VIEW COMPARISON:  09/27/2020 FINDINGS: No acute airspace disease or effusion. Normal cardiomediastinal silhouette with aortic atherosclerosis. No pneumothorax. IMPRESSION: No active disease. Electronically Signed   By: Donavan Foil M.D.   On: 09/21/2021 23:57   CT Head Wo Contrast  Result Date: 09/21/2021 CLINICAL DATA:  Head trauma, moderate-severe. Pt seen here 9/2 for fall and laceration to head. Pt has had worsening dizziness, additional fall last night with lower back pain and some confusion per family. EXAM: CT HEAD WITHOUT CONTRAST TECHNIQUE: Contiguous axial images were obtained from the base of the skull through the vertex without intravenous contrast. RADIATION DOSE REDUCTION: This exam was performed according to the departmental dose-optimization program which includes automated exposure control, adjustment of the mA and/or kV according to patient size and/or use of iterative reconstruction technique. COMPARISON:  CT head 09/17/2021 FINDINGS: Brain: No evidence of large-territorial acute infarction.  No parenchymal hemorrhage. No mass lesion. No extra-axial collection. No mass effect or midline shift. No hydrocephalus. Basilar cisterns are patent. Vascular: No hyperdense vessel. Skull: No acute fracture or focal lesion. Sinuses/Orbits: Paranasal sinuses and mastoid air cells are clear. Bilateral lens replacement. Otherwise the orbits are unremarkable. Other: Right scalp skin staples. IMPRESSION: No acute intracranial abnormality. Electronically Signed   By: Iven Finn M.D.   On: 09/21/2021 20:38   DG Lumbar Spine Complete  Result Date: 09/21/2021 CLINICAL DATA:  Fall, low back pain EXAM: LUMBAR SPINE - COMPLETE 4+ VIEW COMPARISON:  None Available. FINDINGS: Five lumbar-type vertebral bodies. Normal lumbar lordosis.  Mild lumbar levoscoliosis. No evidence of fracture or dislocation. Vertebral body heights are maintained. Mild degenerative changes of the lower lumbar spine. Visualized bony pelvis appears intact. IMPRESSION: Negative. Electronically Signed   By: Julian Hy M.D.   On: 09/21/2021 20:35   DG Knee Complete 4 Views Left  Result Date: 09/17/2021 CLINICAL DATA:  left wrist pain post fall EXAM: LEFT KNEE - COMPLETE  4+ VIEW COMPARISON:  None Available. FINDINGS: No evidence of fracture, dislocation, or joint effusion. Mild tricompartmental degenerative changes of the knee. No evidence of arthropathy or other focal bone abnormality. Soft tissues are unremarkable. IMPRESSION: No acute displaced fracture or dislocation. Electronically Signed   By: Iven Finn M.D.   On: 09/17/2021 00:44   DG Wrist Complete Left  Result Date: 09/17/2021 CLINICAL DATA:  left wrist pain post fall EXAM: LEFT WRIST - COMPLETE 3+ VIEW COMPARISON:  None Available. FINDINGS: There is no evidence of fracture or dislocation. Severe degenerative changes of the first carpometacarpal joint. Soft tissues are unremarkable. IMPRESSION: No acute displaced fracture or dislocation. Electronically Signed   By: Iven Finn M.D.   On: 09/17/2021 00:37   CT Head Wo Contrast  Result Date: 09/17/2021 CLINICAL DATA:  Head trauma, GCS=15, no focal neuro findings (low risk) (Ped 0-17y); Neck pain, acute, no red flags EXAM: CT HEAD WITHOUT CONTRAST CT CERVICAL SPINE WITHOUT CONTRAST TECHNIQUE: Multidetector CT imaging of the head and cervical spine was performed following the standard protocol without intravenous contrast. Multiplanar CT image reconstructions of the cervical spine were also generated. RADIATION DOSE REDUCTION: This exam was performed according to the departmental dose-optimization program which includes automated exposure control, adjustment of the mA and/or kV according to patient size and/or use of iterative reconstruction technique. COMPARISON:  CT cervical spine 11/16/2020, CT head 09/27/2020 FINDINGS: CT HEAD FINDINGS BRAIN: BRAIN Patchy and confluent areas of decreased attenuation are noted throughout the deep and periventricular white matter of the cerebral hemispheres bilaterally, compatible with chronic microvascular ischemic disease. No evidence of large-territorial acute infarction. No parenchymal hemorrhage. No mass lesion. No extra-axial collection. No mass effect or midline shift. No hydrocephalus. Basilar cisterns are patent. Vascular: No hyperdense vessel. Skull: No acute fracture or focal lesion. Sinuses/Orbits: Paranasal sinuses and mastoid air cells are clear. Bilateral lens replacement. Otherwise the orbits are unremarkable. Other: None. CT CERVICAL SPINE FINDINGS Alignment: Normal. Skull base and vertebrae: Chronic stable anterior wedge compression deformity of the C5 vertebral body. Multilevel mild-to-moderate degenerative changes of the spine. No acute fracture. No aggressive appearing focal osseous lesion or focal pathologic process. Soft tissues and spinal canal: No prevertebral fluid or swelling. No visible canal hematoma. Upper chest: Biapical pleural/pulmonary scarring. Other: None.  IMPRESSION: 1. No acute intracranial abnormality. 2. No acute displaced fracture or traumatic listhesis of the cervical spine. Electronically Signed   By: Iven Finn M.D.   On: 09/17/2021 00:36   CT Cervical Spine Wo Contrast  Result Date: 09/17/2021 CLINICAL DATA:  Head trauma, GCS=15, no focal neuro findings (low risk) (Ped 0-17y); Neck pain, acute, no red flags EXAM: CT HEAD WITHOUT CONTRAST CT CERVICAL SPINE WITHOUT CONTRAST TECHNIQUE: Multidetector CT imaging of the head and cervical spine was performed following the standard protocol without intravenous contrast. Multiplanar CT image reconstructions of the cervical spine were also generated. RADIATION DOSE REDUCTION: This exam was performed according to the departmental dose-optimization program which includes automated exposure control, adjustment of the mA and/or kV according to patient size and/or use of iterative reconstruction technique. COMPARISON:  CT cervical spine 11/16/2020, CT head 09/27/2020 FINDINGS: CT HEAD FINDINGS BRAIN: BRAIN Patchy and confluent areas of decreased attenuation are noted throughout the deep and periventricular white matter of the cerebral hemispheres bilaterally, compatible with chronic microvascular ischemic disease. No evidence of large-territorial acute infarction. No parenchymal hemorrhage. No mass lesion. No extra-axial collection. No mass effect or midline shift. No hydrocephalus. Basilar cisterns are patent.  Vascular: No hyperdense vessel. Skull: No acute fracture or focal lesion. Sinuses/Orbits: Paranasal sinuses and mastoid air cells are clear. Bilateral lens replacement. Otherwise the orbits are unremarkable. Other: None. CT CERVICAL SPINE FINDINGS Alignment: Normal. Skull base and vertebrae: Chronic stable anterior wedge compression deformity of the C5 vertebral body. Multilevel mild-to-moderate degenerative changes of the spine. No acute fracture. No aggressive appearing focal osseous lesion or focal  pathologic process. Soft tissues and spinal canal: No prevertebral fluid or swelling. No visible canal hematoma. Upper chest: Biapical pleural/pulmonary scarring. Other: None. IMPRESSION: 1. No acute intracranial abnormality. 2. No acute displaced fracture or traumatic listhesis of the cervical spine. Electronically Signed   By: Iven Finn M.D.   On: 09/17/2021 00:36      Subjective: Patient seen and examined at bedside. Pleasantly confused. No fever, seizures, vomiting reported.  Discharge Exam: Vitals:   09/25/21 2114 09/26/21 0545  BP: 133/79 (!) 138/58  Pulse: 78 64  Resp: 20 17  Temp: 97.9 F (36.6 C) 98.5 F (36.9 C)  SpO2: 95% 96%    General: Pt is awake, pleasantly confused. On room air Cardiovascular: rate controlled, S1/S2 + Respiratory: bilateral decreased breath sounds at bases with scattered crackles Abdominal: Soft, NT, ND, bowel sounds + Extremities: trace lower extremity edema; no cyanosis    The results of significant diagnostics from this hospitalization (including imaging, microbiology, ancillary and laboratory) are listed below for reference.     Microbiology: Recent Results (from the past 240 hour(s))  Urine Culture     Status: None   Collection Time: 09/22/21 12:56 PM   Specimen: Urine, Clean Catch  Result Value Ref Range Status   Specimen Description   Final    URINE, CLEAN CATCH Performed at [facility], [place] [address] [place], [place] [address]    Special Requests   Final    NONE Performed at [facility], [place] [address] [place], [place] [address]    Culture   Final    NO GROWTH Performed at [facility], [place] [address], [place], [place] [address]    Report Status 09/23/2021 FINAL  Final     Labs: BNP (last 3 results) No results for input(s): "BNP" in the last 8760 hours. Basic Metabolic Panel: Recent Labs  Lab 09/21/21 2101 09/22/21 0349 09/23/21 0432  NA 137 139 140  K 3.7 3.4*  3.6  CL 104 105 107  CO2 '25 25 26  '$ GLUCOSE 94 88 93  BUN '13 12 9  '$ CREATININE 0.88 0.74 0.69  CALCIUM 8.4* 8.3* 8.3*  MG  --  2.0 2.3  PHOS  --  1.3*  --    Liver Function Tests: Recent Labs  Lab 09/22/21 0349  AST 17  ALT 10  ALKPHOS 72  BILITOT 0.8  PROT 6.4*  ALBUMIN 3.4*   No results for input(s): "LIPASE", "AMYLASE" in the last 168 hours. Recent Labs  Lab 09/22/21 0349  AMMONIA 25   CBC: Recent Labs  Lab 09/21/21 2101 09/22/21 0349 09/23/21 0432  WBC 6.1 5.2 4.5  NEUTROABS 3.9  --   --   HGB 14.2 13.7 13.9  HCT 40.9 39.9 40.8  MCV 93.4 95.2 96.0  PLT 200 187 183   Cardiac Enzymes: No results for input(s): "CKTOTAL", "CKMB", "CKMBINDEX", "TROPONINI" in the last 168 hours. BNP: Invalid input(s): "POCBNP" CBG: No results for input(s): "GLUCAP" in the last 168 hours. D-Dimer No results for input(s): "DDIMER" in the last 72 hours. Hgb  A1c No results for input(s): "HGBA1C" in the last 72 hours. Lipid Profile No results for input(s): "CHOL", "HDL", "LDLCALC", "TRIG", "CHOLHDL", "LDLDIRECT" in the last 72 hours. Thyroid function studies No results for input(s): "TSH", "T4TOTAL", "T3FREE", "THYROIDAB" in the last 72 hours.  Invalid input(s): "FREET3" Anemia work up No results for input(s): "VITAMINB12", "FOLATE", "FERRITIN", "TIBC", "IRON", "RETICCTPCT" in the last 72 hours. Urinalysis    Component Value Date/Time   COLORURINE YELLOW 09/21/2021 2333   APPEARANCEUR CLOUDY (A) 09/21/2021 2333   LABSPEC 1.025 09/21/2021 2333   PHURINE 6.0 09/21/2021 2333   GLUCOSEU NEGATIVE 09/21/2021 2333   HGBUR NEGATIVE 09/21/2021 2333   BILIRUBINUR SMALL (A) 09/21/2021 2333   KETONESUR 40 (A) 09/21/2021 2333   PROTEINUR 30 (A) 09/21/2021 2333   NITRITE NEGATIVE 09/21/2021 2333   LEUKOCYTESUR NEGATIVE 09/21/2021 2333   Sepsis Labs Recent Labs  Lab 09/21/21 2101 09/22/21 0349 09/23/21 0432  WBC 6.1 5.2 4.5   Microbiology Recent Results (from the past 240  hour(s))  Urine Culture     Status: None   Collection Time: 09/22/21 12:56 PM   Specimen: Urine, Clean Catch  Result Value Ref Range Status   Specimen Description   Final    URINE, CLEAN CATCH Performed at [facility], [place] [address], [place], [place] [address]    Special Requests   Final    NONE Performed at [facility], [place] [address], [place], [place] [address]    Culture   Final    NO GROWTH Performed at [facility], [place] [address] [place], [place] [address]    Report Status 09/23/2021 FINAL  Final     Time coordinating discharge: 35 minutes  SIGNED:   Aline August, MD  [facility] 09/26/2021, 8:52 AM

## 2021-09-26 NOTE — Consult Note (Signed)
   [facility] CM Inpatient Consult   09/26/2021  Merrin Mcvicker [birth year] [id number]  [facility] [ACO] Patient:  Primary Care Provider:  Kathyrn Lass, MD, [facility] Physician at [facility] Liaison coverage for [facility]  Call received from inpatient Transition Of Care [TOC]  [facility] to check eligibility with [facility] and confirmed status.  We have reviewed your referral request.  Of note, [facility] Care Management does not interfere with any arrangements made by the inpatient [facility] team.  For questions,  Natividad Brood, RN BSN [place]  [phone] business mobile phone Toll free office [phone]  *[place]  [phone] Fax number: [phone] [email or web address]

## 2021-09-26 NOTE — Progress Notes (Signed)
Occupational Therapy Treatment Patient Details Name: Cheyenne Andrews MRN: [id number] DOB: 10/22/1933 Today's Date: 09/26/2021   History of present illness 88 yo female  who presented to the ED with dizziness, falls, and confusion  admitted with acute encephalopathy, multiple falls most recent resulting in  head lac. TKW:[id number], anxiety, and history of C2 fracture  MRI =punctate foci of mild DWI hyperintensity in left occipital  lobe could represent acute or subacute infarcts versus artifact. No  significant edema or mass effect.   OT comments  Pt making slow but steady progress toward goals. Pt feeling mildly dizzy this am and walked in on pt having bowel movement in bed so treatment limited due to dizziness. Pt transferred to commode with more assist due to needing to get there quickly.  Min assist provided for tranfers and max assist to clean self due to not feeling well.  Pt feeling better after bowel movement and left in chair with alarm and call bell.    Recommendations for follow up therapy are one component of a multi-disciplinary discharge planning process, led by the attending physician.  Recommendations may be updated based on patient status, additional functional criteria and insurance authorization.    Follow Up Recommendations  Skilled nursing-short term rehab (<3 hours/day)    Assistance Recommended at Discharge Frequent or constant Supervision/Assistance  Patient can return home with the following  A little help with walking and/or transfers;A little help with bathing/dressing/bathroom;Assistance with cooking/housework;Assist for transportation;Help with stairs or ramp for entrance;Direct supervision/assist for medications management;Direct supervision/assist for financial management   Equipment Recommendations  None recommended by OT    Recommendations for Other Services      Precautions / Restrictions Precautions Precautions: Fall Restrictions Weight Bearing  Restrictions: No       Mobility Bed Mobility Overal bed mobility: Needs Assistance Bed Mobility: Rolling, Supine to Sit Rolling: Supervision (cues) Sidelying to sit: Mod assist       General bed mobility comments: multimodal cues for log roll technique as pt reports back pain after falls at home    Transfers Overall transfer level: Needs assistance Equipment used: 1 person hand held assist Transfers: Sit to/from Stand, Bed to chair/wheelchair/BSC Sit to Stand: Min assist Stand pivot transfers: Min assist         General transfer comment: cues for hand placement and only to get started with transfers. Pt felt dizzy during session today in sitting and when on feet.     Balance Overall balance assessment: History of Falls Sitting-balance support: Feet supported Sitting balance-Leahy Scale: Good     Standing balance support: During functional activity Standing balance-Leahy Scale: Poor Standing balance comment: initial posterior LOB, reliant on UEs and external assist. pt would have fallen without external assist.                           ADL either performed or assessed with clinical judgement   ADL Overall ADL's : Needs assistance/impaired     Grooming: Minimal assistance;Wash/dry hands;Wash/dry face;Standing Grooming Details (indicate cue type and reason): Pt stood at sink to groom. Pt c/o dizziness reaching for anything to hold to despite not losing balance.                 Toilet Transfer: Minimal Producer, television/film/video Details (indicate cue type and reason): Pt mid bowel movement so transfer completed without a walker. Toileting- Clothing Manipulation and Hygiene: Moderate assistance;Sit to/from stand;Cueing for compensatory techniques Toileting - Clothing  Manipulation Details (indicate cue type and reason): Pt assisted with cleaning self and pulled mesh pants up.  Pt with multiple bowel movements.  To ensure pt was  clean, pt required up to max assist at times in standing to clean self.     Functional mobility during ADLs: Minimal assistance;Rolling walker (2 wheels) General ADL Comments: Pt limited by decreased balance, feeling dizzy and not being comfortable with use of walker although needing one.    Extremity/Trunk Assessment Upper Extremity Assessment Upper Extremity Assessment: Overall WFL for tasks assessed   Lower Extremity Assessment Lower Extremity Assessment: Defer to PT evaluation        Vision   Vision Assessment?: No apparent visual deficits   Perception     Praxis      Cognition Arousal/Alertness: Awake/alert Behavior During Therapy: Anxious Overall Cognitive Status: Impaired/Different from baseline Area of Impairment: Orientation, Memory, Safety/judgement, Awareness                 Orientation Level: Place, Time Current Attention Level: Focused Memory: Decreased short-term memory Following Commands: Follows one step commands with increased time, Follows multi-step commands inconsistently Safety/Judgement: Decreased awareness of safety, Decreased awareness of deficits Awareness: Intellectual   General Comments: Pt very focused on her bowels this am and stomach pain.  Pt had large bowel movement in bed on arrival. Pt moved to [facility] to finish and was cleaned up.        Exercises      Shoulder Instructions       General Comments Pt mildly confuesd this am and feeling dizzy requiring more assist for mobility due to being unsteady on feet.    Pertinent Vitals/ Pain       Pain Assessment Pain Assessment: No/denies pain  Home Living                                          Prior Functioning/Environment              Frequency  Min 2X/week        Progress Toward Goals  OT Goals(current goals can now be found in the care plan section)  Progress towards OT goals: Progressing toward goals  Acute Rehab OT Goals Patient Stated  Goal: to not be so dependent on everyone OT Goal Formulation: With patient Time For Goal Achievement: 10/07/21 Potential to Achieve Goals: Good ADL Goals Pt Will Perform Lower Body Dressing: with modified independence;sit to/from stand Pt Will Transfer to Toilet: with modified independence;ambulating;regular height toilet;grab bars Pt Will Perform Toileting - Clothing Manipulation and hygiene: with modified independence;sit to/from stand Additional ADL Goal #1: Patient will stand at sink to perform grooming task as evidence of improving standing balance  Plan Discharge plan remains appropriate    Co-evaluation                 AM-PAC OT "6 Clicks" Daily Activity     Outcome Measure   Help from another person eating meals?: A Little Help from another person taking care of personal grooming?: A Little Help from another person toileting, which includes using toliet, bedpan, or urinal?: A Lot Help from another person bathing (including washing, rinsing, drying)?: A Lot Help from another person to put on and taking off regular upper body clothing?: A Little Help from another person to put on and taking off regular lower body clothing?: A Lot 6  Click Score: 15    End of Session Equipment Utilized During Treatment: Rolling walker (2 wheels)  OT Visit Diagnosis: Unsteadiness on feet (R26.81)   Activity Tolerance Patient limited by fatigue   Patient Left in chair;with call bell/phone within reach   Nurse Communication Mobility status        Time: 1116-1140 OT Time Calculation (min): 24 min  Charges: OT General Charges $OT Visit: 1 Visit OT Evaluation $OT Eval Moderate Complexity: 1 Mod    Glenford Peers 09/26/2021, 11:51 AM

## 2021-09-27 ENCOUNTER — Other Ambulatory Visit: Payer: Self-pay | Admitting: *Deleted

## 2021-09-27 DIAGNOSIS — E539 Vitamin B deficiency, unspecified: Secondary | ICD-10-CM | POA: Diagnosis not present

## 2021-09-27 DIAGNOSIS — N390 Urinary tract infection, site not specified: Secondary | ICD-10-CM | POA: Diagnosis not present

## 2021-09-27 DIAGNOSIS — I639 Cerebral infarction, unspecified: Secondary | ICD-10-CM | POA: Diagnosis not present

## 2021-09-27 NOTE — Patient Outreach (Signed)
Mrs. Duran admitted to [facility] on 09/26/21 under [place] [facility] waiver.  Will notify [facility] SNF SW writer is following for transition plans and needs.   Marthenia Rolling, MSN, RN,BSN [place] Acute Care Coordinator [phone] [facility]) [phone]  (Toll free office)

## 2021-09-28 DIAGNOSIS — I5032 Chronic diastolic (congestive) heart failure: Secondary | ICD-10-CM | POA: Diagnosis not present

## 2021-09-28 DIAGNOSIS — E785 Hyperlipidemia, unspecified: Secondary | ICD-10-CM | POA: Diagnosis not present

## 2021-09-28 DIAGNOSIS — F0390 Unspecified dementia without behavioral disturbance: Secondary | ICD-10-CM | POA: Diagnosis not present

## 2021-09-28 DIAGNOSIS — Z9181 History of falling: Secondary | ICD-10-CM | POA: Diagnosis not present

## 2021-09-28 DIAGNOSIS — G9341 Metabolic encephalopathy: Secondary | ICD-10-CM | POA: Diagnosis not present

## 2021-09-28 DIAGNOSIS — F419 Anxiety disorder, unspecified: Secondary | ICD-10-CM | POA: Diagnosis not present

## 2021-09-28 DIAGNOSIS — E538 Deficiency of other specified B group vitamins: Secondary | ICD-10-CM | POA: Diagnosis not present

## 2021-09-28 DIAGNOSIS — I69398 Other sequelae of cerebral infarction: Secondary | ICD-10-CM | POA: Diagnosis not present

## 2021-09-28 DIAGNOSIS — I10 Essential (primary) hypertension: Secondary | ICD-10-CM | POA: Diagnosis not present

## 2021-10-02 DIAGNOSIS — E785 Hyperlipidemia, unspecified: Secondary | ICD-10-CM | POA: Diagnosis not present

## 2021-10-02 DIAGNOSIS — M6281 Muscle weakness (generalized): Secondary | ICD-10-CM | POA: Diagnosis not present

## 2021-10-02 DIAGNOSIS — I69398 Other sequelae of cerebral infarction: Secondary | ICD-10-CM | POA: Diagnosis not present

## 2021-10-02 DIAGNOSIS — I10 Essential (primary) hypertension: Secondary | ICD-10-CM | POA: Diagnosis not present

## 2021-10-02 DIAGNOSIS — G9341 Metabolic encephalopathy: Secondary | ICD-10-CM | POA: Diagnosis not present

## 2021-10-02 DIAGNOSIS — F419 Anxiety disorder, unspecified: Secondary | ICD-10-CM | POA: Diagnosis not present

## 2021-10-02 DIAGNOSIS — I5032 Chronic diastolic (congestive) heart failure: Secondary | ICD-10-CM | POA: Diagnosis not present

## 2021-10-02 DIAGNOSIS — F0390 Unspecified dementia without behavioral disturbance: Secondary | ICD-10-CM | POA: Diagnosis not present

## 2021-10-02 DIAGNOSIS — D519 Vitamin B12 deficiency anemia, unspecified: Secondary | ICD-10-CM | POA: Diagnosis not present

## 2021-10-02 DIAGNOSIS — Z9181 History of falling: Secondary | ICD-10-CM | POA: Diagnosis not present

## 2021-10-04 ENCOUNTER — Other Ambulatory Visit: Payer: Self-pay | Admitting: *Deleted

## 2021-10-04 DIAGNOSIS — M545 Low back pain, unspecified: Secondary | ICD-10-CM | POA: Diagnosis not present

## 2021-10-04 DIAGNOSIS — E539 Vitamin B deficiency, unspecified: Secondary | ICD-10-CM | POA: Diagnosis not present

## 2021-10-04 DIAGNOSIS — I639 Cerebral infarction, unspecified: Secondary | ICD-10-CM | POA: Diagnosis not present

## 2021-10-04 NOTE — Patient Outreach (Signed)
[place] Coordinator follow up. Mrs. Cheyenne Andrews resides in [facility]. Screening for potential [facility] care coordination/care management needs as benefit of insurance plan and PCP.   Facility site visit to [employer] [employer]. Met with Cheyenne Andrews, SNF SW. Mrs. Cheyenne Andrews is from home alone. Care plan meeting scheduled for this Friday with family. Family has expressed interest in ALF placement post SNF. However, transition plans are not certain. Mrs. Cheyenne Andrews is progressing with therapy.  Went to bedside to speak with Mrs. Cheyenne Andrews. She advises Probation officer to contact her daughter Cheyenne Andrews regarding [facility] services. Left [facility] literature and writer's contact information at bedside.   Will continue to follow.   Marthenia Rolling, MSN, RN,BSN [place] Acute Care Coordinator [phone] (Direct dial)

## 2021-10-05 DIAGNOSIS — Z9181 History of falling: Secondary | ICD-10-CM | POA: Diagnosis not present

## 2021-10-05 DIAGNOSIS — I5032 Chronic diastolic (congestive) heart failure: Secondary | ICD-10-CM | POA: Diagnosis not present

## 2021-10-05 DIAGNOSIS — I10 Essential (primary) hypertension: Secondary | ICD-10-CM | POA: Diagnosis not present

## 2021-10-05 DIAGNOSIS — F0390 Unspecified dementia without behavioral disturbance: Secondary | ICD-10-CM | POA: Diagnosis not present

## 2021-10-05 DIAGNOSIS — G9341 Metabolic encephalopathy: Secondary | ICD-10-CM | POA: Diagnosis not present

## 2021-10-05 DIAGNOSIS — E785 Hyperlipidemia, unspecified: Secondary | ICD-10-CM | POA: Diagnosis not present

## 2021-10-05 DIAGNOSIS — I69398 Other sequelae of cerebral infarction: Secondary | ICD-10-CM | POA: Diagnosis not present

## 2021-10-05 DIAGNOSIS — F419 Anxiety disorder, unspecified: Secondary | ICD-10-CM | POA: Diagnosis not present

## 2021-10-09 DIAGNOSIS — Z9181 History of falling: Secondary | ICD-10-CM | POA: Diagnosis not present

## 2021-10-09 DIAGNOSIS — I69398 Other sequelae of cerebral infarction: Secondary | ICD-10-CM | POA: Diagnosis not present

## 2021-10-09 DIAGNOSIS — I5032 Chronic diastolic (congestive) heart failure: Secondary | ICD-10-CM | POA: Diagnosis not present

## 2021-10-09 DIAGNOSIS — E785 Hyperlipidemia, unspecified: Secondary | ICD-10-CM | POA: Diagnosis not present

## 2021-10-09 DIAGNOSIS — I10 Essential (primary) hypertension: Secondary | ICD-10-CM | POA: Diagnosis not present

## 2021-10-09 DIAGNOSIS — R4182 Altered mental status, unspecified: Secondary | ICD-10-CM | POA: Diagnosis not present

## 2021-10-09 DIAGNOSIS — F0390 Unspecified dementia without behavioral disturbance: Secondary | ICD-10-CM | POA: Diagnosis not present

## 2021-10-09 DIAGNOSIS — M6281 Muscle weakness (generalized): Secondary | ICD-10-CM | POA: Diagnosis not present

## 2021-10-09 DIAGNOSIS — F419 Anxiety disorder, unspecified: Secondary | ICD-10-CM | POA: Diagnosis not present

## 2021-10-09 DIAGNOSIS — G9341 Metabolic encephalopathy: Secondary | ICD-10-CM | POA: Diagnosis not present

## 2021-10-12 ENCOUNTER — Other Ambulatory Visit: Payer: Self-pay | Admitting: *Deleted

## 2021-10-12 DIAGNOSIS — I10 Essential (primary) hypertension: Secondary | ICD-10-CM | POA: Diagnosis not present

## 2021-10-12 DIAGNOSIS — Z9181 History of falling: Secondary | ICD-10-CM | POA: Diagnosis not present

## 2021-10-12 DIAGNOSIS — E785 Hyperlipidemia, unspecified: Secondary | ICD-10-CM | POA: Diagnosis not present

## 2021-10-12 DIAGNOSIS — F419 Anxiety disorder, unspecified: Secondary | ICD-10-CM | POA: Diagnosis not present

## 2021-10-12 DIAGNOSIS — I69398 Other sequelae of cerebral infarction: Secondary | ICD-10-CM | POA: Diagnosis not present

## 2021-10-12 DIAGNOSIS — G9341 Metabolic encephalopathy: Secondary | ICD-10-CM | POA: Diagnosis not present

## 2021-10-12 DIAGNOSIS — I5032 Chronic diastolic (congestive) heart failure: Secondary | ICD-10-CM | POA: Diagnosis not present

## 2021-10-12 DIAGNOSIS — F0390 Unspecified dementia without behavioral disturbance: Secondary | ICD-10-CM | POA: Diagnosis not present

## 2021-10-12 NOTE — Patient Outreach (Signed)
[facility] Post- Acute Care Coordinator follow up.   Secure communication sent to [facility] SW to inquire about transition plans and barriers.   Will continue to follow.   Marthenia Rolling, MSN, RN,BSN [place] Acute Care Coordinator [phone] (Direct dial)

## 2021-10-15 DIAGNOSIS — S32040G Wedge compression fracture of fourth lumbar vertebra, subsequent encounter for fracture with delayed healing: Secondary | ICD-10-CM

## 2021-10-15 HISTORY — DX: Wedge compression fracture of fourth lumbar vertebra, subsequent encounter for fracture with delayed healing: S32.040G

## 2021-10-16 DIAGNOSIS — G9341 Metabolic encephalopathy: Secondary | ICD-10-CM | POA: Diagnosis not present

## 2021-10-16 DIAGNOSIS — R4182 Altered mental status, unspecified: Secondary | ICD-10-CM | POA: Diagnosis not present

## 2021-10-16 DIAGNOSIS — I10 Essential (primary) hypertension: Secondary | ICD-10-CM | POA: Diagnosis not present

## 2021-10-16 DIAGNOSIS — F0390 Unspecified dementia without behavioral disturbance: Secondary | ICD-10-CM | POA: Diagnosis not present

## 2021-10-16 DIAGNOSIS — I5032 Chronic diastolic (congestive) heart failure: Secondary | ICD-10-CM | POA: Diagnosis not present

## 2021-10-16 DIAGNOSIS — I69398 Other sequelae of cerebral infarction: Secondary | ICD-10-CM | POA: Diagnosis not present

## 2021-10-16 DIAGNOSIS — F419 Anxiety disorder, unspecified: Secondary | ICD-10-CM | POA: Diagnosis not present

## 2021-10-16 DIAGNOSIS — E785 Hyperlipidemia, unspecified: Secondary | ICD-10-CM | POA: Diagnosis not present

## 2021-10-16 DIAGNOSIS — Z9181 History of falling: Secondary | ICD-10-CM | POA: Diagnosis not present

## 2021-10-16 DIAGNOSIS — F32A Depression, unspecified: Secondary | ICD-10-CM | POA: Diagnosis not present

## 2021-10-17 ENCOUNTER — Other Ambulatory Visit: Payer: Self-pay | Admitting: *Deleted

## 2021-10-17 NOTE — Patient Outreach (Signed)
[facility] Post- Acute Care Coordinator follow up. Mrs. Cheyenne Andrews resides in [facility].   Update received from [place] [place] SW indicating family is looking into ALF placement post SNF.   Will continue to follow.   Marthenia Rolling, MSN, RN,BSN [place] Acute Care Coordinator [phone] (Direct dial)

## 2021-10-19 DIAGNOSIS — E785 Hyperlipidemia, unspecified: Secondary | ICD-10-CM | POA: Diagnosis not present

## 2021-10-19 DIAGNOSIS — F0390 Unspecified dementia without behavioral disturbance: Secondary | ICD-10-CM | POA: Diagnosis not present

## 2021-10-19 DIAGNOSIS — F419 Anxiety disorder, unspecified: Secondary | ICD-10-CM | POA: Diagnosis not present

## 2021-10-19 DIAGNOSIS — G9341 Metabolic encephalopathy: Secondary | ICD-10-CM | POA: Diagnosis not present

## 2021-10-19 DIAGNOSIS — Z9181 History of falling: Secondary | ICD-10-CM | POA: Diagnosis not present

## 2021-10-19 DIAGNOSIS — I10 Essential (primary) hypertension: Secondary | ICD-10-CM | POA: Diagnosis not present

## 2021-10-19 DIAGNOSIS — I69398 Other sequelae of cerebral infarction: Secondary | ICD-10-CM | POA: Diagnosis not present

## 2021-10-19 DIAGNOSIS — I5032 Chronic diastolic (congestive) heart failure: Secondary | ICD-10-CM | POA: Diagnosis not present

## 2021-10-23 DIAGNOSIS — Z9181 History of falling: Secondary | ICD-10-CM | POA: Diagnosis not present

## 2021-10-23 DIAGNOSIS — I10 Essential (primary) hypertension: Secondary | ICD-10-CM | POA: Diagnosis not present

## 2021-10-23 DIAGNOSIS — I5032 Chronic diastolic (congestive) heart failure: Secondary | ICD-10-CM | POA: Diagnosis not present

## 2021-10-23 DIAGNOSIS — G9341 Metabolic encephalopathy: Secondary | ICD-10-CM | POA: Diagnosis not present

## 2021-10-23 DIAGNOSIS — E785 Hyperlipidemia, unspecified: Secondary | ICD-10-CM | POA: Diagnosis not present

## 2021-10-23 DIAGNOSIS — F419 Anxiety disorder, unspecified: Secondary | ICD-10-CM | POA: Diagnosis not present

## 2021-10-23 DIAGNOSIS — F0390 Unspecified dementia without behavioral disturbance: Secondary | ICD-10-CM | POA: Diagnosis not present

## 2021-10-23 DIAGNOSIS — I69398 Other sequelae of cerebral infarction: Secondary | ICD-10-CM | POA: Diagnosis not present

## 2021-10-26 DIAGNOSIS — I10 Essential (primary) hypertension: Secondary | ICD-10-CM | POA: Diagnosis not present

## 2021-10-26 DIAGNOSIS — I5032 Chronic diastolic (congestive) heart failure: Secondary | ICD-10-CM | POA: Diagnosis not present

## 2021-10-26 DIAGNOSIS — F0390 Unspecified dementia without behavioral disturbance: Secondary | ICD-10-CM | POA: Diagnosis not present

## 2021-10-26 DIAGNOSIS — Z9181 History of falling: Secondary | ICD-10-CM | POA: Diagnosis not present

## 2021-10-26 DIAGNOSIS — I69398 Other sequelae of cerebral infarction: Secondary | ICD-10-CM | POA: Diagnosis not present

## 2021-10-26 DIAGNOSIS — E785 Hyperlipidemia, unspecified: Secondary | ICD-10-CM | POA: Diagnosis not present

## 2021-10-26 DIAGNOSIS — F419 Anxiety disorder, unspecified: Secondary | ICD-10-CM | POA: Diagnosis not present

## 2021-10-26 DIAGNOSIS — G9341 Metabolic encephalopathy: Secondary | ICD-10-CM | POA: Diagnosis not present

## 2021-10-30 DIAGNOSIS — F419 Anxiety disorder, unspecified: Secondary | ICD-10-CM | POA: Diagnosis not present

## 2021-10-30 DIAGNOSIS — I69398 Other sequelae of cerebral infarction: Secondary | ICD-10-CM | POA: Diagnosis not present

## 2021-10-30 DIAGNOSIS — F0390 Unspecified dementia without behavioral disturbance: Secondary | ICD-10-CM | POA: Diagnosis not present

## 2021-10-30 DIAGNOSIS — I10 Essential (primary) hypertension: Secondary | ICD-10-CM | POA: Diagnosis not present

## 2021-10-30 DIAGNOSIS — I5032 Chronic diastolic (congestive) heart failure: Secondary | ICD-10-CM | POA: Diagnosis not present

## 2021-10-30 DIAGNOSIS — E785 Hyperlipidemia, unspecified: Secondary | ICD-10-CM | POA: Diagnosis not present

## 2021-10-30 DIAGNOSIS — Z9181 History of falling: Secondary | ICD-10-CM | POA: Diagnosis not present

## 2021-10-30 DIAGNOSIS — G9341 Metabolic encephalopathy: Secondary | ICD-10-CM | POA: Diagnosis not present

## 2021-11-01 ENCOUNTER — Other Ambulatory Visit: Payer: Self-pay | Admitting: *Deleted

## 2021-11-01 NOTE — Patient Outreach (Signed)
[facility] Post- Acute Care Coordinator follow up.   Secure communication sent to [facility] SW to inquire about transition plans and barriers.   Will follow.    Marthenia Rolling, MSN, RN,BSN [place] Acute Care Coordinator [phone] (Direct dial)

## 2021-11-02 ENCOUNTER — Other Ambulatory Visit: Payer: Self-pay | Admitting: *Deleted

## 2021-11-02 DIAGNOSIS — G9341 Metabolic encephalopathy: Secondary | ICD-10-CM | POA: Diagnosis not present

## 2021-11-02 DIAGNOSIS — I5032 Chronic diastolic (congestive) heart failure: Secondary | ICD-10-CM | POA: Diagnosis not present

## 2021-11-02 DIAGNOSIS — I10 Essential (primary) hypertension: Secondary | ICD-10-CM | POA: Diagnosis not present

## 2021-11-02 DIAGNOSIS — I69398 Other sequelae of cerebral infarction: Secondary | ICD-10-CM | POA: Diagnosis not present

## 2021-11-02 DIAGNOSIS — E785 Hyperlipidemia, unspecified: Secondary | ICD-10-CM | POA: Diagnosis not present

## 2021-11-02 DIAGNOSIS — Z9181 History of falling: Secondary | ICD-10-CM | POA: Diagnosis not present

## 2021-11-02 DIAGNOSIS — F0390 Unspecified dementia without behavioral disturbance: Secondary | ICD-10-CM | POA: Diagnosis not present

## 2021-11-02 DIAGNOSIS — F419 Anxiety disorder, unspecified: Secondary | ICD-10-CM | POA: Diagnosis not present

## 2021-11-02 NOTE — Patient Outreach (Signed)
[place] Coordinator follow up.  Mrs. Paquette resides in [facility].   Went to beside to speak with Mrs. Maricela Bo and daughter at [facility]. They report Mrs. Timothy will return home tomorrow 11/03/21 with home health. Unsure of agency name.   Discussed importance of follow up with PCP. Discussed PCP office [facility] has [facility] care management.   Writer will notify [facility] upon SNF discharge.    Marthenia Rolling, MSN, RN,BSN [place] Acute Care Coordinator [phone] (Direct dial)

## 2021-11-03 DIAGNOSIS — F32A Depression, unspecified: Secondary | ICD-10-CM | POA: Diagnosis not present

## 2021-11-03 DIAGNOSIS — E785 Hyperlipidemia, unspecified: Secondary | ICD-10-CM | POA: Diagnosis not present

## 2021-11-03 DIAGNOSIS — G9341 Metabolic encephalopathy: Secondary | ICD-10-CM | POA: Diagnosis not present

## 2021-11-04 DIAGNOSIS — Z9181 History of falling: Secondary | ICD-10-CM | POA: Diagnosis not present

## 2021-11-04 DIAGNOSIS — G9341 Metabolic encephalopathy: Secondary | ICD-10-CM | POA: Diagnosis not present

## 2021-11-04 DIAGNOSIS — M6281 Muscle weakness (generalized): Secondary | ICD-10-CM | POA: Diagnosis not present

## 2021-11-04 DIAGNOSIS — I69398 Other sequelae of cerebral infarction: Secondary | ICD-10-CM | POA: Diagnosis not present

## 2021-11-04 DIAGNOSIS — Z85828 Personal history of other malignant neoplasm of skin: Secondary | ICD-10-CM | POA: Diagnosis not present

## 2021-11-04 DIAGNOSIS — E538 Deficiency of other specified B group vitamins: Secondary | ICD-10-CM | POA: Diagnosis not present

## 2021-11-04 DIAGNOSIS — I110 Hypertensive heart disease with heart failure: Secondary | ICD-10-CM | POA: Diagnosis not present

## 2021-11-04 DIAGNOSIS — Z7982 Long term (current) use of aspirin: Secondary | ICD-10-CM | POA: Diagnosis not present

## 2021-11-04 DIAGNOSIS — R41841 Cognitive communication deficit: Secondary | ICD-10-CM | POA: Diagnosis not present

## 2021-11-04 DIAGNOSIS — Z90710 Acquired absence of both cervix and uterus: Secondary | ICD-10-CM | POA: Diagnosis not present

## 2021-11-04 DIAGNOSIS — I5032 Chronic diastolic (congestive) heart failure: Secondary | ICD-10-CM | POA: Diagnosis not present

## 2021-11-04 DIAGNOSIS — F0393 Unspecified dementia, unspecified severity, with mood disturbance: Secondary | ICD-10-CM | POA: Diagnosis not present

## 2021-11-04 DIAGNOSIS — R2681 Unsteadiness on feet: Secondary | ICD-10-CM | POA: Diagnosis not present

## 2021-11-04 DIAGNOSIS — Z7983 Long term (current) use of bisphosphonates: Secondary | ICD-10-CM | POA: Diagnosis not present

## 2021-11-04 DIAGNOSIS — E785 Hyperlipidemia, unspecified: Secondary | ICD-10-CM | POA: Diagnosis not present

## 2021-11-04 DIAGNOSIS — R2689 Other abnormalities of gait and mobility: Secondary | ICD-10-CM | POA: Diagnosis not present

## 2021-11-04 DIAGNOSIS — F0394 Unspecified dementia, unspecified severity, with anxiety: Secondary | ICD-10-CM | POA: Diagnosis not present

## 2021-11-04 DIAGNOSIS — F32A Depression, unspecified: Secondary | ICD-10-CM | POA: Diagnosis not present

## 2021-11-04 DIAGNOSIS — I69328 Other speech and language deficits following cerebral infarction: Secondary | ICD-10-CM | POA: Diagnosis not present

## 2021-11-06 DIAGNOSIS — G9341 Metabolic encephalopathy: Secondary | ICD-10-CM | POA: Diagnosis not present

## 2021-11-06 DIAGNOSIS — R2689 Other abnormalities of gait and mobility: Secondary | ICD-10-CM | POA: Diagnosis not present

## 2021-11-06 DIAGNOSIS — M6281 Muscle weakness (generalized): Secondary | ICD-10-CM | POA: Diagnosis not present

## 2021-11-06 DIAGNOSIS — I69328 Other speech and language deficits following cerebral infarction: Secondary | ICD-10-CM | POA: Diagnosis not present

## 2021-11-06 DIAGNOSIS — I69398 Other sequelae of cerebral infarction: Secondary | ICD-10-CM | POA: Diagnosis not present

## 2021-11-06 DIAGNOSIS — R2681 Unsteadiness on feet: Secondary | ICD-10-CM | POA: Diagnosis not present

## 2021-11-07 DIAGNOSIS — Z8673 Personal history of transient ischemic attack (TIA), and cerebral infarction without residual deficits: Secondary | ICD-10-CM | POA: Diagnosis not present

## 2021-11-07 DIAGNOSIS — Z9989 Dependence on other enabling machines and devices: Secondary | ICD-10-CM | POA: Diagnosis not present

## 2021-11-07 DIAGNOSIS — Z23 Encounter for immunization: Secondary | ICD-10-CM | POA: Diagnosis not present

## 2021-11-07 DIAGNOSIS — M545 Low back pain, unspecified: Secondary | ICD-10-CM | POA: Diagnosis not present

## 2021-11-07 DIAGNOSIS — Z7189 Other specified counseling: Secondary | ICD-10-CM | POA: Diagnosis not present

## 2021-11-07 DIAGNOSIS — Z09 Encounter for follow-up examination after completed treatment for conditions other than malignant neoplasm: Secondary | ICD-10-CM | POA: Diagnosis not present

## 2021-11-08 DIAGNOSIS — R2689 Other abnormalities of gait and mobility: Secondary | ICD-10-CM | POA: Diagnosis not present

## 2021-11-08 DIAGNOSIS — I69328 Other speech and language deficits following cerebral infarction: Secondary | ICD-10-CM | POA: Diagnosis not present

## 2021-11-08 DIAGNOSIS — R2681 Unsteadiness on feet: Secondary | ICD-10-CM | POA: Diagnosis not present

## 2021-11-08 DIAGNOSIS — I69398 Other sequelae of cerebral infarction: Secondary | ICD-10-CM | POA: Diagnosis not present

## 2021-11-08 DIAGNOSIS — G9341 Metabolic encephalopathy: Secondary | ICD-10-CM | POA: Diagnosis not present

## 2021-11-08 DIAGNOSIS — M6281 Muscle weakness (generalized): Secondary | ICD-10-CM | POA: Diagnosis not present

## 2021-11-09 ENCOUNTER — Emergency Department (HOSPITAL_BASED_OUTPATIENT_CLINIC_OR_DEPARTMENT_OTHER)
Admission: EM | Admit: 2021-11-09 | Discharge: 2021-11-09 | Disposition: A | Discharge status: Home / Self Care | Payer: Medicare Other | Attending: Emergency Medicine | Admitting: Emergency Medicine

## 2021-11-09 ENCOUNTER — Other Ambulatory Visit: Payer: Self-pay

## 2021-11-09 ENCOUNTER — Encounter (HOSPITAL_BASED_OUTPATIENT_CLINIC_OR_DEPARTMENT_OTHER): Payer: Self-pay | Admitting: Emergency Medicine

## 2021-11-09 ENCOUNTER — Emergency Department (HOSPITAL_BASED_OUTPATIENT_CLINIC_OR_DEPARTMENT_OTHER): Payer: Medicare Other

## 2021-11-09 DIAGNOSIS — M545 Low back pain, unspecified: Secondary | ICD-10-CM | POA: Diagnosis not present

## 2021-11-09 DIAGNOSIS — W19XXXA Unspecified fall, initial encounter: Secondary | ICD-10-CM | POA: Insufficient documentation

## 2021-11-09 DIAGNOSIS — S32040A Wedge compression fracture of fourth lumbar vertebra, initial encounter for closed fracture: Secondary | ICD-10-CM

## 2021-11-09 DIAGNOSIS — I5032 Chronic diastolic (congestive) heart failure: Secondary | ICD-10-CM | POA: Diagnosis not present

## 2021-11-09 DIAGNOSIS — S32048A Other fracture of fourth lumbar vertebra, initial encounter for closed fracture: Secondary | ICD-10-CM | POA: Diagnosis not present

## 2021-11-09 DIAGNOSIS — S3992XA Unspecified injury of lower back, initial encounter: Secondary | ICD-10-CM | POA: Diagnosis present

## 2021-11-09 DIAGNOSIS — I10 Essential (primary) hypertension: Secondary | ICD-10-CM | POA: Diagnosis not present

## 2021-11-09 HISTORY — DX: Other amnesia: R41.3

## 2021-11-09 HISTORY — DX: Cerebral infarction, unspecified: I63.9

## 2021-11-09 MED ORDER — LIDOCAINE 5 % EX PTCH
1.0000 | MEDICATED_PATCH | CUTANEOUS | Status: DC
  Administered 2021-11-09: 1 via TRANSDERMAL
  Filled 2021-11-09: qty 1

## 2021-11-09 MED ORDER — IBUPROFEN 400 MG PO TABS
400.0000 mg | ORAL_TABLET | Freq: Once | ORAL | Status: AC
  Administered 2021-11-09: 400 mg via ORAL
  Filled 2021-11-09: qty 1

## 2021-11-09 MED ORDER — ACETAMINOPHEN 500 MG PO TABS
1000.0000 mg | ORAL_TABLET | Freq: Once | ORAL | Status: AC
Start: 2021-11-09 — End: 2021-11-09
  Administered 2021-11-09: 1000 mg via ORAL
  Filled 2021-11-09: qty 2

## 2021-11-09 NOTE — ED Provider Notes (Signed)
[facility] EMERGENCY DEPARTMENT Provider Note   CSN: [id number] Arrival date & time: 11/09/21  2330     History  Chief Complaint  Patient presents with   Back Pain   Leg Pain    Cheyenne Andrews is a 88 y.o. female.  Patient is an 88 year old female with a history of prior stroke, memory changes and referred recurrent falls.  Patient is here today with complaint of low back pain radiating into her right leg.  Patient's daughter is also present and helps with history.  Patient has just completed a 40-day stay at rehab.  She went home on Friday and has been home approximately 6 days.  Since being home she is complaining more of lower back pain but has still been up ambulating with her walker.  Yesterday she complained of a lot of pain in her back and going down her right leg and reports she did take some Tylenol for that but because she has had persistent pain they came today.  Patient was still able to get up and ambulate to ride in the car here.  She has not taken any pain medication this morning.  She has no prior history of back surgery but her daughter reports that over the last month at rehab she has intermittently complained of back pain.  She does not think she was imaged during her initial evaluation after the last fall and is unsure if she injured her back during that time.  Patient denies any weakness of her legs, new urinary symptoms and daughter reports otherwise her mental status has been stable.  The history is provided by the patient and a relative.  Back Pain Associated symptoms: leg pain   Leg Pain Associated symptoms: back pain        Home Medications Prior to Admission medications   Medication Sig Start Date End Date Taking? Authorizing Provider  aspirin EC 81 MG tablet Take 1 tablet (81 mg total) by mouth daily. Patient not taking: Reported on 09/22/2021 03/26/14   Barton Dubois, MD  atorvastatin (LIPITOR) 80 MG tablet Take 1 tablet (80 mg total) by  mouth daily. 09/26/21   Aline August, MD  cyanocobalamin (VITAMIN B12) 1000 MCG tablet Take 1 tablet (1,000 mcg total) by mouth daily. 09/26/21   Aline August, MD  FLUoxetine (PROZAC) 20 MG capsule Take 20 mg by mouth daily.    [provider]  Hypromellose (GENTEAL MILD OP) Apply to eye as needed (for eye dryness).    [provider]  ibandronate (BONIVA) 150 MG tablet Take 150 mg by mouth every 30 (thirty) days. Take in the morning with a full glass of water, on an empty stomach, and do not take anything else by mouth or lie down for the next 30 min. Patient not taking: Reported on 09/22/2021    [provider]      Allergies    Patient has no known allergies.    Review of Systems   Review of Systems  Musculoskeletal:  Positive for back pain.    Physical Exam Updated Vital Signs BP (!) 168/80   Pulse 64   Temp 98.4 F (36.9 C)   Resp 18   Ht '5\' 3"'$  (1.6 m)   Wt 54.9 kg   SpO2 97%   BMI 21.43 kg/m  Physical Exam Vitals and nursing note reviewed.  Constitutional:      General: She is not in acute distress.    Appearance: She is well-developed.  HENT:  Head: Normocephalic and atraumatic.  Eyes:     Pupils: Pupils are equal, round, and reactive to light.  Cardiovascular:     Rate and Rhythm: Normal rate and regular rhythm.     Heart sounds: Normal heart sounds. No murmur heard.    No friction rub.  Pulmonary:     Effort: Pulmonary effort is normal.     Breath sounds: Normal breath sounds. No wheezing or rales.  Abdominal:     General: Bowel sounds are normal. There is no distension.     Palpations: Abdomen is soft.     Tenderness: There is no abdominal tenderness. There is no guarding or rebound.  Musculoskeletal:        General: No tenderness. Normal range of motion.       Back:     Comments: No edema.  Palpable pain in bilateral paralumbar muscles.  No centralized pain with palpation  Skin:    General: Skin is warm and dry.      Findings: No rash.  Neurological:     Mental Status: She is alert and oriented to person, place, and time. Mental status is at baseline.     Cranial Nerves: No cranial nerve deficit.     Comments: 5 out of 5 strength in bilateral lower extremities.  Positive leg raise on the right and minimally on the left  Psychiatric:        Mood and Affect: Mood normal.        Behavior: Behavior normal.     ED Results / Procedures / Treatments   Labs (all labs ordered are listed, but only abnormal results are displayed) Labs Reviewed - No data to display  EKG None  Radiology CT Lumbar Spine Wo Contrast  Result Date: 11/09/2021 CLINICAL DATA:  Low back pain EXAM: CT LUMBAR SPINE WITHOUT CONTRAST TECHNIQUE: Multidetector CT imaging of the lumbar spine was performed without intravenous contrast administration. Multiplanar CT image reconstructions were also generated. RADIATION DOSE REDUCTION: This exam was performed according to the departmental dose-optimization program which includes automated exposure control, adjustment of the mA and/or kV according to patient size and/or use of iterative reconstruction technique. COMPARISON:  None Available. FINDINGS: Segmentation: 5 lumbar type vertebrae. Alignment: Normal. Vertebrae: There is an acute compression deformity at the superior endplate of L4 with retropulsion an approximate 20% height loss. There is mild-to-moderate spinal canal stenosis at this level. Paraspinal and other soft tissues: Gallbladder is fluid-filled and distended. No definite evidence of cholelithiasis. Bilateral kidneys are without evidence of hydronephrosis or nephrolithiasis. The abdominal aorta is in caliber. There is presumably a cystic lesion in the right adnexa with which is incompletely imaged. There are atherosclerotic calcifications of the abdominal aorta. Disc levels: Moderate spinal canal stenosis at L3-L4 secondary to mild retropulsion. Moderate left neural foraminal stenosis at  L5-S1 and L2-L3. Moderate right neural foraminal stenosis L4-L5. IMPRESSION: 1. Acute compression deformity at the superior endplate of L4 with mild retropulsion and approximate 20% height loss. Mild-to-moderate spinal canal stenosis at this level. 2. Additional degenerative changes, as above. Aortic Atherosclerosis (ICD10-I70.0). Electronically Signed   By: Marin Roberts M.D.   On: 11/09/2021 12:21    Procedures Procedures    Medications Ordered in ED Medications  lidocaine (LIDODERM) 5 % 1 patch (1 patch Transdermal Patch Applied 11/09/21 1050)  acetaminophen (TYLENOL) tablet 1,000 mg (1,000 mg Oral Given 11/09/21 1049)  ibuprofen (ADVIL) tablet 400 mg (400 mg Oral Given 11/09/21 1446)    ED Course/ Medical Decision  Making/ A&P                           Medical Decision Making Amount and/or Complexity of Data Reviewed External Data Reviewed: notes. Radiology: ordered and independent interpretation performed. Decision-making details documented in ED Course.  Risk OTC drugs. Prescription drug management.   Pt with multiple medical problems and comorbidities and presenting today with a complaint that caries a high risk for morbidity and mortality. Pt with gradual onset of back pain suggestive of radiculopathy.  No neurovascular compromise and no incontinence.  Pt has no infectious sx, hx of CA  or other red flags concerning for pathologic back pain.  Pt is able to ambulate but is painful.  Normal strength and reflexes on exam.  Denies trauma since being home but did have trauma 1 month ago before the back pain started.  Will give pt pain control with Tylenol and Lidoderm patch however concern for mind altering medications in this elderly female who already struggles with dizziness, memory and balance problems.  We will do a CT of the lumbar spine to ensure no compression fractures.  And to return for developement of above sx.  2:52 PM I have independently visualized and interpreted pt's  images today. Imaging looks concerning for a compression fracture.  Radiology reports an acute compression deformity at the superior endplate of L4 with mild retropulsion and approximately 20% height loss with mild to moderate canal stenosis at this level.  Discussed the findings with Dr. Christella Noa today.  He recommended a TLSO and pain control.  Patient will also need to follow-up in 1-1/2 to 2 weeks with him.  She has seen him in the past for her cervical fractures.  Findings were discussed with the patient and her daughter.  TLSO ordered and waiting for fitting.  Feel that patient will be stable for discharge home today.  She was still able to ambulate with her walker.  She will need to can continue Tylenol, Voltaren gel and lidocaine patches.  She already has PT and OT coming to her home and her daughter will watch her more closely in the next few days to make sure she is tolerating getting around with the brace okay.          Final Clinical Impression(s) / ED Diagnoses Final diagnoses:  Compression fracture of L4 vertebra, initial encounter [facility])    Rx / DC Orders ED Discharge Orders     None         Blanchie Dessert, MD 11/09/21 1453

## 2021-11-09 NOTE — ED Provider Notes (Signed)
3:50 PM Care assumed from Dr. Maryan Rued.  At time of transfer care, patient is awaiting for TLSO brace placement in place and will be discharged home to follow-up with Dr. Christella Noa with neurosurgery.  4:23 PM TLSO successfully applied by the technician and patient is tolerating it well.  She will be discharged to follow-up with her neurosurgeon and understands return precautions and plan of care.  We went through all the findings on the CT scan which she will follow-up with her PCP for the other incidental findings.  She otherwise her concerns and was discharged in good condition.  Clinical Impression: 1. Compression fracture of L4 vertebra, initial encounter [facility])     Disposition: Discharge  Condition: Good  I have discussed the results, Dx and Tx plan with the pt(& family if present). He/she/they expressed understanding and agree(s) with the plan. Discharge instructions discussed at great length. Strict return precautions discussed and pt &/or family have verbalized understanding of the instructions. No further questions at time of discharge.    New Prescriptions   No medications on file    Follow Up: Ashok Pall, MD [address] [place] [address] [phone]   call the office tomorrow to set up a follow up appointment for 1.5 to 2 weeks from now.     Jadien Lehigh, Gwenyth Allegra, MD 11/09/21 1623

## 2021-11-09 NOTE — Discharge Instructions (Addendum)
You do have a compression fracture today of the spine (that is basically where the bone has been smashed).  Most likely occurred during your last fall.  You need to wear the brace when you are up moving around.  You can try to wear it to sleep but if to uncomfortable you can take it off.  Take extra strength tylenol 2 tabs every 6 hours and then you can also take 2 ibuprofen ('400mg'$ ) for breakthrough pain.  Do not take the ibuprofen more than every 6 hours.

## 2021-11-09 NOTE — ED Triage Notes (Signed)
Pt is having lower back and right leg pain which has been chronic in nature. Pt having more pain per family member.  Pt having some memory issues.  Pt states she cannot lie down, cannot sleep.

## 2021-11-13 ENCOUNTER — Encounter (HOSPITAL_BASED_OUTPATIENT_CLINIC_OR_DEPARTMENT_OTHER): Payer: Self-pay | Admitting: Emergency Medicine

## 2021-11-13 ENCOUNTER — Emergency Department (HOSPITAL_BASED_OUTPATIENT_CLINIC_OR_DEPARTMENT_OTHER)
Admission: EM | Admit: 2021-11-13 | Discharge: 2021-11-13 | Disposition: A | Discharge status: Home / Self Care | Payer: Medicare Other | Attending: Emergency Medicine | Admitting: Emergency Medicine

## 2021-11-13 ENCOUNTER — Other Ambulatory Visit: Payer: Self-pay

## 2021-11-13 DIAGNOSIS — M545 Low back pain, unspecified: Secondary | ICD-10-CM | POA: Insufficient documentation

## 2021-11-13 DIAGNOSIS — T148XXA Other injury of unspecified body region, initial encounter: Secondary | ICD-10-CM | POA: Diagnosis not present

## 2021-11-13 DIAGNOSIS — W19XXXA Unspecified fall, initial encounter: Secondary | ICD-10-CM | POA: Insufficient documentation

## 2021-11-13 DIAGNOSIS — G8911 Acute pain due to trauma: Secondary | ICD-10-CM | POA: Diagnosis not present

## 2021-11-13 MED ORDER — HYDROCODONE-ACETAMINOPHEN 5-325 MG PO TABS: 0.5000 | ORAL_TABLET | ORAL | 0 refills | Status: DC | PRN

## 2021-11-13 MED ORDER — HYDROCODONE-ACETAMINOPHEN 5-325 MG PO TABS
0.5000 | ORAL_TABLET | Freq: Once | ORAL | Status: AC
  Administered 2021-11-13: 0.5 via ORAL
  Filled 2021-11-13: qty 1

## 2021-11-13 NOTE — ED Triage Notes (Addendum)
Pt seen for a fall on 10/26. States she is in pain despite taking prescribed pain medications and using the back brace she was given. Pt is ambulatory with 1 person assist. A&O, accompanied by her daughter. Pt lives by herself, has memory issues, and medicates herself. Pt is unsure of when she took last dose.

## 2021-11-13 NOTE — ED Provider Notes (Signed)
[name] Provider Note: Cheyenne Spurling, MD, FACEP  CSN: [id number] MRN: [id number] ARRIVAL: 11/13/21 at [place]: [name]  Pain   HISTORY OF PRESENT ILLNESS  11/13/21 4:48 AM Cheyenne Andrews is a 88 y.o. female who was seen 02/24/2058 23 for low back pain after a fall.  She was diagnosed with the 20% anterior compression fracture of L4.  Dr. Reuel Andrews of neurosurgery was consulted and he recommended a TLSO and she was placed in this.  She was prescribed ibuprofen and she has also been taking Tylenol.  These have not adequately controlled her pain, especially when she is walking.  The pain does radiate to her right leg.  She denies any new numbness or weakness.  She cannot quantify her pain but it is significant enough that she is having difficulty performing activities of daily living.   Past Medical History:  Diagnosis Date   C2 cervical fracture ([place])    Memory changes    Stroke [facility])     Past Surgical History:  Procedure Laterality Date   ABDOMINAL HYSTERECTOMY     CATARACT EXTRACTION     SKIN CANCER EXCISION N/A early 2000s   removed from face     History reviewed. No pertinent family history.  Social History   Tobacco Use   Smoking status: Never   Smokeless tobacco: Never  Vaping Use   Vaping Use: Never used  Substance Use Topics   Alcohol use: No   Drug use: No    Prior to Admission medications   Medication Sig Start Date End Date Taking? Authorizing Provider  HYDROcodone-acetaminophen (NORCO) 5-325 MG tablet Take 0.5 tablets by mouth every 4 (four) hours as needed for severe pain (May cause constipation or confusion.). 11/13/21  Yes Cheyenne Marmo, MD  atorvastatin (LIPITOR) 80 MG tablet Take 1 tablet (80 mg total) by mouth daily. 09/26/21   Cheyenne August, MD  cyanocobalamin (VITAMIN B12) 1000 MCG tablet Take 1 tablet (1,000 mcg total) by mouth daily. 09/26/21   Cheyenne August, MD  FLUoxetine (PROZAC) 20 MG capsule Take 20 mg by  mouth daily.    [provider]  Hypromellose (GENTEAL MILD OP) Apply to eye as needed (for eye dryness).    [provider]    Allergies Patient has no known allergies.   REVIEW OF SYSTEMS  Negative except as noted here or in the History of Present Illness.   PHYSICAL EXAMINATION  Initial Vital Signs Blood pressure (!) 198/100, pulse 65, temperature 97.9 F (36.6 C), temperature source Oral, height '5\' 3"'$  (1.6 m), weight 53.5 kg, SpO2 98 %.  Examination General: Well-developed, well-nourished female in no acute distress; appearance consistent with age of record HENT: normocephalic; atraumatic Eyes: Normal appearance Neck: supple Heart: regular rate and rhythm Lungs: clear to auscultation bilaterally Abdomen: soft; nondistended; nontender; bowel sounds present Extremities: No acute deformity Neurologic: Awake, alert and oriented; motor function intact in all extremities and symmetric; sensation intact in lower extremities and symmetric no facial droop Skin: Warm and dry Psychiatric: Normal mood and affect   RESULTS  Summary of this visit's results, reviewed and interpreted by myself:   EKG Interpretation  Date/Time:    Ventricular Rate:    PR Interval:    QRS Duration:   QT Interval:    QTC Calculation:   R Axis:     Text Interpretation:         Laboratory Studies: No results found for this or any previous visit (  from the past 24 hour(s)). Imaging Studies: No results found.  ED COURSE and MDM  Nursing notes, initial and subsequent vitals signs, including pulse oximetry, reviewed and interpreted by myself.  Vitals:   11/13/21 0435 11/13/21 0438 11/13/21 0445 11/13/21 0449  BP:      Pulse: 69  65   Resp:    18  Temp:      TempSrc:      SpO2: 96%  98%   Weight:  53.5 kg    Height:  '5\' 3"'$  (1.6 m)     Medications  HYDROcodone-acetaminophen (NORCO/VICODIN) 5-325 MG per tablet 0.5 tablet (has no administration in time range)   We will  try low-dose hydrocodone.  Her daughter will be with her when she first starts taking this drug to make sure it does not cause her confusion, somnolence or other adverse effects.  She was advised of its propensity to cause constipation and she may need to take a laxative or stool softener to prevent this.  We will not try tramadol as she is on Prozac and the combination risks serotonin syndrome.  She has an appointment pending with Cheyenne Andrews.   PROCEDURES  Procedures   ED DIAGNOSES     ICD-10-CM   1. Pain due to fracture  T14.8XXA          Cheyenne Andrews, Cheyenne Reichmann, MD 11/13/21 [phone]

## 2021-11-14 ENCOUNTER — Inpatient Hospital Stay: Payer: Medicare Other | Admitting: Diagnostic Neuroimaging

## 2021-11-16 DIAGNOSIS — R2681 Unsteadiness on feet: Secondary | ICD-10-CM | POA: Diagnosis not present

## 2021-11-16 DIAGNOSIS — I69398 Other sequelae of cerebral infarction: Secondary | ICD-10-CM | POA: Diagnosis not present

## 2021-11-16 DIAGNOSIS — M6281 Muscle weakness (generalized): Secondary | ICD-10-CM | POA: Diagnosis not present

## 2021-11-16 DIAGNOSIS — G9341 Metabolic encephalopathy: Secondary | ICD-10-CM | POA: Diagnosis not present

## 2021-11-16 DIAGNOSIS — I69328 Other speech and language deficits following cerebral infarction: Secondary | ICD-10-CM | POA: Diagnosis not present

## 2021-11-16 DIAGNOSIS — R2689 Other abnormalities of gait and mobility: Secondary | ICD-10-CM | POA: Diagnosis not present

## 2021-11-21 DIAGNOSIS — S12490A Other displaced fracture of fifth cervical vertebra, initial encounter for closed fracture: Secondary | ICD-10-CM | POA: Diagnosis not present

## 2021-12-01 DIAGNOSIS — I10 Essential (primary) hypertension: Secondary | ICD-10-CM | POA: Diagnosis not present

## 2021-12-01 DIAGNOSIS — I5032 Chronic diastolic (congestive) heart failure: Secondary | ICD-10-CM | POA: Diagnosis not present

## 2021-12-04 DIAGNOSIS — Z9181 History of falling: Secondary | ICD-10-CM | POA: Diagnosis not present

## 2021-12-04 DIAGNOSIS — E538 Deficiency of other specified B group vitamins: Secondary | ICD-10-CM | POA: Diagnosis not present

## 2021-12-04 DIAGNOSIS — I5032 Chronic diastolic (congestive) heart failure: Secondary | ICD-10-CM | POA: Diagnosis not present

## 2021-12-04 DIAGNOSIS — Z7983 Long term (current) use of bisphosphonates: Secondary | ICD-10-CM | POA: Diagnosis not present

## 2021-12-04 DIAGNOSIS — I69398 Other sequelae of cerebral infarction: Secondary | ICD-10-CM | POA: Diagnosis not present

## 2021-12-04 DIAGNOSIS — F0393 Unspecified dementia, unspecified severity, with mood disturbance: Secondary | ICD-10-CM | POA: Diagnosis not present

## 2021-12-04 DIAGNOSIS — I69328 Other speech and language deficits following cerebral infarction: Secondary | ICD-10-CM | POA: Diagnosis not present

## 2021-12-04 DIAGNOSIS — Z7982 Long term (current) use of aspirin: Secondary | ICD-10-CM | POA: Diagnosis not present

## 2021-12-04 DIAGNOSIS — R41841 Cognitive communication deficit: Secondary | ICD-10-CM | POA: Diagnosis not present

## 2021-12-04 DIAGNOSIS — R2681 Unsteadiness on feet: Secondary | ICD-10-CM | POA: Diagnosis not present

## 2021-12-04 DIAGNOSIS — Z90710 Acquired absence of both cervix and uterus: Secondary | ICD-10-CM | POA: Diagnosis not present

## 2021-12-04 DIAGNOSIS — F0394 Unspecified dementia, unspecified severity, with anxiety: Secondary | ICD-10-CM | POA: Diagnosis not present

## 2021-12-04 DIAGNOSIS — E785 Hyperlipidemia, unspecified: Secondary | ICD-10-CM | POA: Diagnosis not present

## 2021-12-04 DIAGNOSIS — M6281 Muscle weakness (generalized): Secondary | ICD-10-CM | POA: Diagnosis not present

## 2021-12-04 DIAGNOSIS — G9341 Metabolic encephalopathy: Secondary | ICD-10-CM | POA: Diagnosis not present

## 2021-12-04 DIAGNOSIS — R2689 Other abnormalities of gait and mobility: Secondary | ICD-10-CM | POA: Diagnosis not present

## 2021-12-04 DIAGNOSIS — I110 Hypertensive heart disease with heart failure: Secondary | ICD-10-CM | POA: Diagnosis not present

## 2021-12-04 DIAGNOSIS — Z85828 Personal history of other malignant neoplasm of skin: Secondary | ICD-10-CM | POA: Diagnosis not present

## 2021-12-04 DIAGNOSIS — F32A Depression, unspecified: Secondary | ICD-10-CM | POA: Diagnosis not present

## 2021-12-22 DIAGNOSIS — I10 Essential (primary) hypertension: Secondary | ICD-10-CM | POA: Diagnosis not present

## 2021-12-22 DIAGNOSIS — I5032 Chronic diastolic (congestive) heart failure: Secondary | ICD-10-CM | POA: Diagnosis not present

## 2021-12-25 DIAGNOSIS — S32040D Wedge compression fracture of fourth lumbar vertebra, subsequent encounter for fracture with routine healing: Secondary | ICD-10-CM | POA: Diagnosis not present

## 2022-02-07 ENCOUNTER — Other Ambulatory Visit: Payer: Self-pay | Admitting: Family Medicine

## 2022-02-07 DIAGNOSIS — N83201 Unspecified ovarian cyst, right side: Secondary | ICD-10-CM | POA: Diagnosis not present

## 2022-02-07 DIAGNOSIS — I700 Atherosclerosis of aorta: Secondary | ICD-10-CM | POA: Diagnosis not present

## 2022-02-07 DIAGNOSIS — R7989 Other specified abnormal findings of blood chemistry: Secondary | ICD-10-CM | POA: Diagnosis not present

## 2022-02-07 DIAGNOSIS — E538 Deficiency of other specified B group vitamins: Secondary | ICD-10-CM | POA: Diagnosis not present

## 2022-02-07 DIAGNOSIS — Z9989 Dependence on other enabling machines and devices: Secondary | ICD-10-CM | POA: Diagnosis not present

## 2022-02-07 DIAGNOSIS — Z6821 Body mass index (BMI) 21.0-21.9, adult: Secondary | ICD-10-CM | POA: Diagnosis not present

## 2022-02-07 DIAGNOSIS — Z8673 Personal history of transient ischemic attack (TIA), and cerebral infarction without residual deficits: Secondary | ICD-10-CM | POA: Diagnosis not present

## 2022-02-13 DIAGNOSIS — R7401 Elevation of levels of liver transaminase levels: Secondary | ICD-10-CM | POA: Diagnosis not present

## 2022-02-13 DIAGNOSIS — R7989 Other specified abnormal findings of blood chemistry: Secondary | ICD-10-CM | POA: Diagnosis not present

## 2022-02-13 DIAGNOSIS — R945 Abnormal results of liver function studies: Secondary | ICD-10-CM | POA: Diagnosis not present

## 2022-02-14 ENCOUNTER — Other Ambulatory Visit: Payer: Self-pay | Admitting: Family Medicine

## 2022-02-14 DIAGNOSIS — R7989 Other specified abnormal findings of blood chemistry: Secondary | ICD-10-CM

## 2022-02-20 ENCOUNTER — Ambulatory Visit
Admission: RE | Admit: 2022-02-20 | Discharge: 2022-02-20 | Disposition: A | Discharge status: Home / Self Care | Payer: Medicare Other | Source: Ambulatory Visit | Attending: Family Medicine | Admitting: Family Medicine

## 2022-02-20 DIAGNOSIS — R7989 Other specified abnormal findings of blood chemistry: Secondary | ICD-10-CM

## 2022-02-20 DIAGNOSIS — R945 Abnormal results of liver function studies: Secondary | ICD-10-CM | POA: Diagnosis not present

## 2022-02-23 ENCOUNTER — Ambulatory Visit
Admission: RE | Admit: 2022-02-23 | Discharge: 2022-02-23 | Disposition: A | Discharge status: Home / Self Care | Payer: Medicare Other | Source: Ambulatory Visit | Attending: Family Medicine | Admitting: Family Medicine

## 2022-02-23 DIAGNOSIS — N83201 Unspecified ovarian cyst, right side: Secondary | ICD-10-CM

## 2022-02-23 DIAGNOSIS — N838 Other noninflammatory disorders of ovary, fallopian tube and broad ligament: Secondary | ICD-10-CM | POA: Diagnosis not present

## 2022-02-23 DIAGNOSIS — Z90710 Acquired absence of both cervix and uterus: Secondary | ICD-10-CM | POA: Diagnosis not present

## 2022-02-23 DIAGNOSIS — N83291 Other ovarian cyst, right side: Secondary | ICD-10-CM | POA: Diagnosis not present

## 2022-03-01 DIAGNOSIS — N949 Unspecified condition associated with female genital organs and menstrual cycle: Secondary | ICD-10-CM | POA: Diagnosis not present

## 2022-03-06 ENCOUNTER — Telehealth: Payer: Self-pay | Admitting: *Deleted

## 2022-03-06 NOTE — Telephone Encounter (Signed)
Spoke with the patient regarding the referral to GYN oncology. Patient scheduled as new patient with Dr Ernestina Patches at 10:30 am. Patient given an arrival time of 10 am.  Explained to the patient the the doctor will perform a pelvic exam at this visit. Patient given the policy that only one visitor allowed and that visitor must be over 16 yrs are allowed in the [place]. Patient given the address/phone number for the clinic and that the center offers free valet service. Patient aware of the mask mandate.

## 2022-03-15 ENCOUNTER — Encounter: Payer: Self-pay | Admitting: Psychiatry

## 2022-03-19 ENCOUNTER — Encounter: Payer: Self-pay | Admitting: Psychiatry

## 2022-03-19 ENCOUNTER — Inpatient Hospital Stay: Payer: Medicare Other | Attending: Psychiatry | Admitting: Psychiatry

## 2022-03-19 ENCOUNTER — Inpatient Hospital Stay: Payer: Medicare Other

## 2022-03-19 ENCOUNTER — Other Ambulatory Visit: Payer: Self-pay

## 2022-03-19 VITALS — BP 142/81 | HR 72 | Temp 97.8°F | Resp 14 | Wt 116.6 lb

## 2022-03-19 DIAGNOSIS — R1900 Intra-abdominal and pelvic swelling, mass and lump, unspecified site: Secondary | ICD-10-CM | POA: Insufficient documentation

## 2022-03-19 DIAGNOSIS — I639 Cerebral infarction, unspecified: Secondary | ICD-10-CM

## 2022-03-19 DIAGNOSIS — R1031 Right lower quadrant pain: Secondary | ICD-10-CM | POA: Diagnosis not present

## 2022-03-19 DIAGNOSIS — Z7982 Long term (current) use of aspirin: Secondary | ICD-10-CM | POA: Diagnosis not present

## 2022-03-19 DIAGNOSIS — Z8673 Personal history of transient ischemic attack (TIA), and cerebral infarction without residual deficits: Secondary | ICD-10-CM | POA: Diagnosis not present

## 2022-03-19 DIAGNOSIS — I5032 Chronic diastolic (congestive) heart failure: Secondary | ICD-10-CM

## 2022-03-19 DIAGNOSIS — R350 Frequency of micturition: Secondary | ICD-10-CM | POA: Insufficient documentation

## 2022-03-19 DIAGNOSIS — W19XXXS Unspecified fall, sequela: Secondary | ICD-10-CM

## 2022-03-19 DIAGNOSIS — Z79899 Other long term (current) drug therapy: Secondary | ICD-10-CM | POA: Insufficient documentation

## 2022-03-19 DIAGNOSIS — N9489 Other specified conditions associated with female genital organs and menstrual cycle: Secondary | ICD-10-CM

## 2022-03-19 DIAGNOSIS — Z90710 Acquired absence of both cervix and uterus: Secondary | ICD-10-CM | POA: Diagnosis not present

## 2022-03-19 DIAGNOSIS — R32 Unspecified urinary incontinence: Secondary | ICD-10-CM | POA: Diagnosis not present

## 2022-03-19 DIAGNOSIS — Z9181 History of falling: Secondary | ICD-10-CM | POA: Insufficient documentation

## 2022-03-19 DIAGNOSIS — I6529 Occlusion and stenosis of unspecified carotid artery: Secondary | ICD-10-CM

## 2022-03-19 DIAGNOSIS — I10 Essential (primary) hypertension: Secondary | ICD-10-CM

## 2022-03-19 DIAGNOSIS — F40240 Claustrophobia: Secondary | ICD-10-CM | POA: Diagnosis present

## 2022-03-19 LAB — CEA (ACCESS): CEA (CHCC): 3.36 ng/mL (ref 0.00–5.00)

## 2022-03-19 NOTE — Patient Instructions (Signed)
Plan on having an ultrasound in three months with follow up after with Dr. Ernestina Patches.

## 2022-03-19 NOTE — Progress Notes (Signed)
GYNECOLOGIC ONCOLOGY NEW PATIENT CONSULTATION  Date of Service: 03/19/2022 Referring Provider: Christophe Louis, MD [facility]   ASSESSMENT AND PLAN: Cheyenne Andrews is a 89 y.o. woman with 7.2cm pelvic mass with a history of stroke and fall in September 2023.  We reviewed that the exact etiology of the pelvic mass is unclear, but could include a benign, borderline, or malignant process.  Reviewed the reassuring findings including a normal CA125, simple appearing nature of the cyst and duration of cyst without any additional intra-abdominal findings.  Reviewed that the measurements between 2022 and now are across two different imaging modalities (CT and pelvic ultrasound), and so there can be some difference and it not be a true change.  Also reviewed that patient's pain seems to be most likely associated with her previous fall as opposed this pelvic mass, in particular since this mass has been around for at least several years and the pain only started after her fall.  Reviewed that often in a postmenopausal woman with a cyst of the size we would consider surgical excision for definitive diagnosis.  However, given her medical comorbidities and overall findings likely reassuring for a benign cyst, we also discussed options for surveillance. Only way to know for certain that this is benign is with surgery however. Patient and her daughter expressed that they would likely never desire removal of the mass, so we could also consider no surveillance if we would not intervene on anything.  We discussed options for surveillance to include pelvic MRI now, however, daughter reports concerns about her mom's claustrophobia and possible poor tolerance of anxiolytics used for her brain MRI in the fall.  Given this, we also discussed option for repeat pelvic ultrasound in 3 months.  Patient and her daughter wish to proceed with this plan.  Will plan for follow-up after pelvic ultrasound.  Otherwise recommend CEA today  to complete tumor marker evaluation.  Pelvic ultrasound in 3 mo. RTC 3 mo.  A copy of this note was sent to the patient's referring provider.  Bernadene Bell, MD Gynecologic Oncology   Medical Decision Making I personally spent  TOTAL 50 minutes face-to-face and non-face-to-face in the care of this patient, which includes all pre, intra, and post visit time on the date of service.   ------------  CC: Pelvic mass  HISTORY OF PRESENT ILLNESS:  Cheyenne Andrews is a 89 y.o. woman who is seen in consultation at the request of Christophe Louis, MD for evaluation of pelvic mass.  Presented to her family medicine provider for right lower quadrant pain.  She reported that this developed after a fall that caused a back injury.  She underwent a pelvic ultrasound on 02/23/2022 which noted a right adnexal lesion measuring 7.2 x 5.4 x 6.2, previously measuring 6.3 x 4.9 in 2022 on CT.  She otherwise has a history of prior hysterectomy.  She had a CA125 drawn which was normal at 5.  Today patient presents with her daughter.  She reports that in September 2023 she experienced a fall and head injury.  She then soon after had a second fall, and during that admission she was worked up with findings of an incidental stroke.  She follows with her PCP Dr. Edwina Barth for some of this.  Since that time, patient reports pain on her right side coming from her back and radiating down through her right lower quadrant and right leg.  She denies having the symptoms prior to the fall.  The pain comes and goes  but is present most days.  She uses a heating pad which can helps.  Patient lives alone at home and uses a walker to get around.  She was able to walk from the far end of the parking lot to the hospital today.  She otherwise denies abdominal bloating, early satiety, significant weight loss, change in bowel habits.  She has urinary frequency and incontinence that has been going on for some time.   PAST MEDICAL HISTORY: Past  Medical History:  Diagnosis Date   C2 cervical fracture (HCC)    Closed compression fracture of L4 lumbar vertebra with delayed healing, subsequent encounter 10/2021   Memory changes    Stroke [facility])     PAST SURGICAL HISTORY: Past Surgical History:  Procedure Laterality Date   ABDOMINAL HYSTERECTOMY     CATARACT EXTRACTION     SKIN CANCER EXCISION N/A early 2000s   removed from face     OB/GYN HISTORY: OB History  Gravida Para Term Preterm AB Living  '3 3 3     3  '$ SAB IAB Ectopic Multiple Live Births          3    # Outcome Date GA Lbr Len/2nd Weight Sex Delivery Anes PTL Lv  3 Term      Vag-Spont   LIV  2 Term      Vag-Spont   LIV  1 Term      Vag-Spont   LIV      Age at menarche: 3 Age at menopause: Unsure, status post hysterectomy Hx of HRT: No Hx of STI: No Last pap: Uncertain, years ago History of abnormal pap smears: None  SCREENING STUDIES:  Last mammogram: 2014 Last colonoscopy: Unsure  MEDICATIONS:  Current Outpatient Medications:    cyanocobalamin (VITAMIN B12) 1000 MCG tablet, Take 1 tablet (1,000 mcg total) by mouth daily., Disp: 30 tablet, Rfl: 0   FLUoxetine (PROZAC) 20 MG capsule, Take 20 mg by mouth daily., Disp: , Rfl:    aspirin 81 MG chewable tablet, 1 tablet Orally Once a day, Disp: , Rfl:   ALLERGIES: No Known Allergies  FAMILY HISTORY: Family History  Problem Relation Age of Onset   Colon cancer Sister    Breast cancer Neg Hx    Ovarian cancer Neg Hx    Uterine cancer Neg Hx     SOCIAL HISTORY: Social History   Socioeconomic History   Marital status: Widowed    Spouse name: Not on file   Number of children: Not on file   Years of education: Not on file   Highest education level: Not on file  Occupational History   Not on file  Tobacco Use   Smoking status: Never   Smokeless tobacco: Never  Vaping Use   Vaping Use: Never used  Substance and Sexual Activity   Alcohol use: No   Drug use: No   Sexual activity: Never   Other Topics Concern   Not on file  Social History Narrative   Not on file   Social Determinants of Health   Financial Resource Strain: Not on file  Food Insecurity: Not on file  Transportation Needs: Not on file  Physical Activity: Not on file  Stress: Not on file  Social Connections: Not on file  Intimate Partner Violence: Not on file    REVIEW OF SYSTEMS: New patient intake form was reviewed.  Complete 10-system review is negative except for the following: Incontinence, dizziness  PHYSICAL EXAM: BP (!) 142/81 (BP Location: Left  Arm, Patient Position: Sitting)   Pulse 72   Temp 97.8 F (36.6 C) (Oral)   Resp 14   Wt 116 lb 9.6 oz (52.9 kg)   SpO2 95%   BMI 20.65 kg/m  Constitutional: No acute distress. Neuro/Psych: Alert, oriented.  Head and Neck: Normocephalic, atraumatic. Neck symmetric without masses. Sclera anicteric.  Respiratory: Normal work of breathing. Clear to auscultation bilaterally. Cardiovascular: Regular rate and rhythm, no murmurs, rubs, or gallops. Abdomen: Normoactive bowel sounds. Soft, non-distended, non-tender to palpation. No masses or hepatosplenomegaly appreciated. No evidence of hernia. No palpable fluid wave.  Extremities: Slow unsteady gait. Warm, well perfused. No edema bilaterally. Skin: No rashes or lesions. Lymphatic: No cervical, supraclavicular, or inguinal adenopathy. Genitourinary: External genitalia without lesions. Urethral meatus without lesions or prolapse. On speculum exam, vagina without lesions. Bimanual exam reveals normal vaginal cuff. Fullness in pelvis but unable to discretely palpate mass. Mild diffuse tenderness to palpation but discrete of  mass. Rectovaginal exam confirms the above findings and reveals normal sphincter tone and no masses or nodularity. Exam chaperoned by Kimberly Martinique, CMA   LABORATORY AND RADIOLOGIC DATA: Outside medical records were reviewed to synthesize the above history, along with the history and  physical obtained during the visit.  Outside laboratory, and imaging reports were reviewed, with pertinent results below.  I personally reviewed the outside images.  WBC  Date Value Ref Range Status  09/23/2021 4.5 4.0 - 10.5 K/uL Final   Hemoglobin  Date Value Ref Range Status  09/23/2021 13.9 12.0 - 15.0 g/dL Final   HCT  Date Value Ref Range Status  09/23/2021 40.8 36.0 - 46.0 % Final   Platelets  Date Value Ref Range Status  09/23/2021 183 150 - 400 K/uL Final   Magnesium  Date Value Ref Range Status  09/23/2021 2.3 1.7 - 2.4 mg/dL Final    Comment:    Performed at [facility], [place] [address], [place], [place] [address]   Creatinine, Ser  Date Value Ref Range Status  09/23/2021 0.69 0.44 - 1.00 mg/dL Final   AST  Date Value Ref Range Status  09/22/2021 17 15 - 41 U/L Final   ALT  Date Value Ref Range Status  09/22/2021 10 0 - 44 U/L Final   CA125 (03/01/22): 5.0  US PELVIC COMPLETE WITH TRANSVAGINAL 02/23/2022  Narrative CLINICAL DATA:  Follow-up adnexal cyst  EXAM: TRANSABDOMINAL AND TRANSVAGINAL ULTRASOUND OF PELVIS  TECHNIQUE: Both transabdominal and transvaginal ultrasound examinations of the pelvis were performed. Transabdominal technique was performed for global imaging of the pelvis including uterus, ovaries, adnexal regions, and pelvic cul-de-sac. It was necessary to proceed with endovaginal exam following the transabdominal exam to visualize the adnexa.  COMPARISON:  CT 11/09/2021, pelvic CT 09/27/2020  FINDINGS: Uterus  Surgically absent  Endometrium  Surgically absent  Right ovary  Measurements: Right adnexal cystic lesion measuring 7.2 x 5.4 x 6.2 cm, previous CT measurements of 6.3 x 4.9 cm in 2022.  Left ovary  Not seen  Other findings  No abnormal free fluid.  IMPRESSION: 1. Status post hysterectomy. 2. 7.2 cm right adnexal cystic lesion, increased in size since 2022. Consider GYN consult and  followup US in 3-6 months, or pelvis MRI w/o and w/ contrast for improved characterization. Note: This recommendation does not apply to premenarchal patients or to those with increased risk (genetic, family history, elevated tumor markers or other high-risk factors) of ovarian cancer. Reference: Radiology 2019 Nov; 293(2):359-371.   Electronically Signed By: Maudie Mercury  Francoise Ceo M.D. On: 02/24/2022 00:12  CT PELVIS WO CONTRAST 09/27/2020  Narrative CLINICAL DATA:  Pelvic trauma.  Fall.  Sacral pain  EXAM: CT PELVIS WITHOUT CONTRAST  TECHNIQUE: Multidetector CT imaging of the pelvis was performed following the standard protocol without intravenous contrast.  COMPARISON:  None.  FINDINGS: Urinary Tract:  Distal ureters and bladder normal.  Bowel:  Unremarkable  Vascular/Lymphatic: No adenopathy  Reproductive: Post hysterectomy. Large ovoid cystic lesion associated of the RIGHT ovary measures 6.3 x 4.9 cm. Cyst has simple fluid attenuation without evidence of complexity on noncontrast exam.  Other:  No free fluid.  Musculoskeletal: No evidence of fracture of the sacrum or pelvis. No acute findings of the coccyx. Diffuse osteopenia noted. No hematoma within the musculature or evidence of trauma.  IMPRESSION: 1. No evidence of acute fracture in the sacrum or pelvis. 2. Diffuse osteopenia the sacrum and pelvis. 3. Large ovoid lesion of the RIGHT ovary has benign appearance on noncontrast CT. Recommend follow-up ultrasound 6 months. This recommendation follows ACR consensus guidelines: White Paper of the ACR Incidental Findings Committee II on Adnexal Findings. J Am Coll Radiol [phone].   Electronically Signed By: Suzy Bouchard M.D. On: 09/27/2020 19:14

## 2022-04-02 ENCOUNTER — Encounter: Payer: Self-pay | Admitting: Psychiatry

## 2022-05-15 DIAGNOSIS — R748 Abnormal levels of other serum enzymes: Secondary | ICD-10-CM | POA: Diagnosis not present

## 2022-05-15 DIAGNOSIS — N83209 Unspecified ovarian cyst, unspecified side: Secondary | ICD-10-CM | POA: Diagnosis not present

## 2022-05-15 DIAGNOSIS — Z0000 Encounter for general adult medical examination without abnormal findings: Secondary | ICD-10-CM | POA: Diagnosis not present

## 2022-05-15 DIAGNOSIS — D692 Other nonthrombocytopenic purpura: Secondary | ICD-10-CM | POA: Diagnosis not present

## 2022-05-15 DIAGNOSIS — F3341 Major depressive disorder, recurrent, in partial remission: Secondary | ICD-10-CM | POA: Diagnosis not present

## 2022-05-15 DIAGNOSIS — Z23 Encounter for immunization: Secondary | ICD-10-CM | POA: Diagnosis not present

## 2022-05-16 ENCOUNTER — Telehealth: Payer: Self-pay

## 2022-05-16 NOTE — Telephone Encounter (Signed)
Pt's daughter, Arline Asp, called stating she would like to cancel her mom's appointment with Dr. Alvester Morin in June. She states they saw Ms.Sharples's PCP (Dr. Jackelyn Poling) yesterday and since everything is coming up benign and she is doing fine, they are just going to watch and not pursue anything further. She states Dr. Atha Starks is in agreement with the family decision.   She wanted to thank Dr.Newton for seeing her and will call back if they need anything in the future.   Dr. Alvester Morin notified

## 2022-06-25 ENCOUNTER — Other Ambulatory Visit: Payer: Medicare Other

## 2022-07-02 ENCOUNTER — Ambulatory Visit: Payer: Medicare Other | Admitting: Psychiatry

## 2022-11-13 DIAGNOSIS — Z23 Encounter for immunization: Secondary | ICD-10-CM | POA: Diagnosis not present

## 2022-11-13 DIAGNOSIS — F3340 Major depressive disorder, recurrent, in remission, unspecified: Secondary | ICD-10-CM | POA: Diagnosis not present

## 2022-12-31 ENCOUNTER — Emergency Department (HOSPITAL_BASED_OUTPATIENT_CLINIC_OR_DEPARTMENT_OTHER)
Admission: EM | Admit: 2022-12-31 | Discharge: 2023-01-01 | Disposition: A | Discharge status: Home / Self Care | Payer: Medicare Other | Attending: Emergency Medicine | Admitting: Emergency Medicine

## 2022-12-31 ENCOUNTER — Emergency Department (HOSPITAL_BASED_OUTPATIENT_CLINIC_OR_DEPARTMENT_OTHER): Payer: Medicare Other

## 2022-12-31 ENCOUNTER — Encounter (HOSPITAL_BASED_OUTPATIENT_CLINIC_OR_DEPARTMENT_OTHER): Payer: Self-pay | Admitting: Urology

## 2022-12-31 ENCOUNTER — Other Ambulatory Visit: Payer: Self-pay

## 2022-12-31 DIAGNOSIS — Z7982 Long term (current) use of aspirin: Secondary | ICD-10-CM | POA: Insufficient documentation

## 2022-12-31 DIAGNOSIS — S40022A Contusion of left upper arm, initial encounter: Secondary | ICD-10-CM | POA: Insufficient documentation

## 2022-12-31 DIAGNOSIS — W19XXXA Unspecified fall, initial encounter: Secondary | ICD-10-CM | POA: Diagnosis not present

## 2022-12-31 DIAGNOSIS — F0390 Unspecified dementia without behavioral disturbance: Secondary | ICD-10-CM | POA: Insufficient documentation

## 2022-12-31 DIAGNOSIS — S0083XA Contusion of other part of head, initial encounter: Secondary | ICD-10-CM | POA: Insufficient documentation

## 2022-12-31 DIAGNOSIS — I700 Atherosclerosis of aorta: Secondary | ICD-10-CM | POA: Diagnosis not present

## 2022-12-31 DIAGNOSIS — R29890 Loss of height: Secondary | ICD-10-CM | POA: Diagnosis not present

## 2022-12-31 DIAGNOSIS — Z043 Encounter for examination and observation following other accident: Secondary | ICD-10-CM | POA: Diagnosis not present

## 2022-12-31 DIAGNOSIS — G319 Degenerative disease of nervous system, unspecified: Secondary | ICD-10-CM | POA: Diagnosis not present

## 2022-12-31 DIAGNOSIS — S32020A Wedge compression fracture of second lumbar vertebra, initial encounter for closed fracture: Secondary | ICD-10-CM

## 2022-12-31 DIAGNOSIS — S32028A Other fracture of second lumbar vertebra, initial encounter for closed fracture: Secondary | ICD-10-CM | POA: Diagnosis not present

## 2022-12-31 DIAGNOSIS — S32041A Stable burst fracture of fourth lumbar vertebra, initial encounter for closed fracture: Secondary | ICD-10-CM | POA: Diagnosis not present

## 2022-12-31 DIAGNOSIS — M545 Low back pain, unspecified: Secondary | ICD-10-CM | POA: Diagnosis present

## 2022-12-31 DIAGNOSIS — I6523 Occlusion and stenosis of bilateral carotid arteries: Secondary | ICD-10-CM | POA: Diagnosis not present

## 2022-12-31 LAB — CBC WITH DIFFERENTIAL/PLATELET
Abs Immature Granulocytes: 0.02 10*3/uL (ref 0.00–0.07)
Basophils Absolute: 0.1 10*3/uL (ref 0.0–0.1)
Basophils Relative: 1 %
Eosinophils Absolute: 0.3 10*3/uL (ref 0.0–0.5)
Eosinophils Relative: 4 %
HCT: 44.3 % (ref 36.0–46.0)
Hemoglobin: 15.2 g/dL — ABNORMAL HIGH (ref 12.0–15.0)
Immature Granulocytes: 0 %
Lymphocytes Relative: 21 %
Lymphs Abs: 1.4 10*3/uL (ref 0.7–4.0)
MCH: 32.4 pg (ref 26.0–34.0)
MCHC: 34.3 g/dL (ref 30.0–36.0)
MCV: 94.5 fL (ref 80.0–100.0)
Monocytes Absolute: 0.7 10*3/uL (ref 0.1–1.0)
Monocytes Relative: 10 %
Neutro Abs: 4.1 10*3/uL (ref 1.7–7.7)
Neutrophils Relative %: 64 %
Platelets: 234 10*3/uL (ref 150–400)
RBC: 4.69 MIL/uL (ref 3.87–5.11)
RDW: 13.7 % (ref 11.5–15.5)
WBC: 6.5 10*3/uL (ref 4.0–10.5)
nRBC: 0 % (ref 0.0–0.2)

## 2022-12-31 MED ORDER — SODIUM CHLORIDE 0.9 % IV BOLUS
1000.0000 mL | Freq: Once | INTRAVENOUS | Status: AC
  Administered 2023-01-01: 1000 mL via INTRAVENOUS

## 2022-12-31 MED ORDER — KETOROLAC TROMETHAMINE 15 MG/ML IJ SOLN
15.0000 mg | Freq: Once | INTRAMUSCULAR | Status: AC
  Administered 2023-01-01: 15 mg via INTRAVENOUS
  Filled 2022-12-31: qty 1

## 2022-12-31 MED ORDER — ACETAMINOPHEN 500 MG PO TABS
1000.0000 mg | ORAL_TABLET | Freq: Once | ORAL | Status: AC
  Administered 2023-01-01: 1000 mg via ORAL
  Filled 2022-12-31: qty 2

## 2022-12-31 NOTE — ED Triage Notes (Signed)
Pt states fall, unwitnessed while family was on a trip  Healing yellowing bruise noted to left upper arm and states lower back pain  Denies any head injury    No blood thinners, h/o dementia per family

## 2022-12-31 NOTE — ED Provider Notes (Signed)
[facility] [facility] Provider Note   CSN: [id number] Arrival date & time: 12/31/22  1810     History  Chief Complaint  Patient presents with   Cheyenne Andrews    Cheyenne Andrews is a 89 y.o. female.  89 yo F with a chief complaint of a fall.  The patient is demented and lives at home by herself.  Family typically check on her off and on but had gone out of town and so did not check on her for about 5 days.  They went to visit her she was unable to get out of bed.  This was a couple days ago.  They rechecked on her today and she was telling him that her back was hurting worse.  She thinks she heard the fall that happened while they were gone.  She has dementia and so has trouble giving me a complete story.  It sounds like she thought maybe she was dizzy when she stood up from bed and she fell against a side table.  There was reportedly some bruising to the face though not evident now.  Level 5 caveat dementia.   Fall       Home Medications Prior to Admission medications   Medication Sig Start Date End Date Taking? Authorizing Provider  diclofenac Sodium (VOLTAREN) 1 % GEL Apply 4 g topically 4 (four) times daily. 01/01/23  Yes Melene Plan, DO  aspirin 81 MG chewable tablet 1 tablet Orally Once a day    [provider]  cyanocobalamin (VITAMIN B12) 1000 MCG tablet Take 1 tablet (1,000 mcg total) by mouth daily. 09/26/21   Glade Lloyd, MD  FLUoxetine (PROZAC) 20 MG capsule Take 20 mg by mouth daily.    [provider]      Allergies    Patient has no known allergies.    Review of Systems   Review of Systems  Physical Exam Updated Vital Signs BP (!) 166/73   Pulse 62   Temp 98.3 F (36.8 C) (Oral)   Resp 15   Ht 5\' 3"  (1.6 m)   Wt 52.9 kg   SpO2 94%   BMI 20.66 kg/m  Physical Exam Vitals and nursing note reviewed.  Constitutional:      General: She is not in acute distress.    Appearance: She is well-developed. She is  not diaphoretic.  HENT:     Head: Normocephalic and atraumatic.  Eyes:     Pupils: Pupils are equal, round, and reactive to light.  Cardiovascular:     Rate and Rhythm: Normal rate and regular rhythm.     Heart sounds: No murmur heard.    No friction rub. No gallop.  Pulmonary:     Effort: Pulmonary effort is normal.     Breath sounds: No wheezing or rales.  Abdominal:     General: There is no distension.     Palpations: Abdomen is soft.     Tenderness: There is no abdominal tenderness.  Musculoskeletal:        General: No tenderness.     Cervical back: Normal range of motion and neck supple.     Comments: She points to her left side low back as area of discomfort.  I see no obvious external signs of trauma to the low back.  No obvious midline spinal tenderness develops or deformities.  She has some bruising about the left lateral humerus.  I do not appreciate any crepitus.  I am able  to range that arm without significant discomfort.  Palpated from head to toe without any obvious other noted areas of bony tenderness.  Skin:    General: Skin is warm and dry.  Neurological:     Mental Status: She is alert and oriented to person, place, and time.  Psychiatric:        Behavior: Behavior normal.     ED Results / Procedures / Treatments   Labs (all labs ordered are listed, but only abnormal results are displayed) Labs Reviewed  CBC WITH DIFFERENTIAL/PLATELET - Abnormal; Notable for the following components:      Result Value   Hemoglobin 15.2 (*)    All other components within normal limits  COMPREHENSIVE METABOLIC PANEL - Abnormal; Notable for the following components:   CO2 21 (*)    Calcium 8.6 (*)    Total Bilirubin 1.6 (*)    All other components within normal limits  CK    EKG None  Radiology CT Head Wo Contrast Result Date: 01/01/2023 CLINICAL DATA:  Fall EXAM: CT HEAD WITHOUT CONTRAST CT CERVICAL SPINE WITHOUT CONTRAST TECHNIQUE: Multidetector CT imaging of the  head and cervical spine was performed following the standard protocol without intravenous contrast. Multiplanar CT image reconstructions of the cervical spine were also generated. RADIATION DOSE REDUCTION: This exam was performed according to the departmental dose-optimization program which includes automated exposure control, adjustment of the mA and/or kV according to patient size and/or use of iterative reconstruction technique. COMPARISON:  None Available. FINDINGS: CT HEAD FINDINGS Brain: There is no mass, hemorrhage or extra-axial collection. There is generalized atrophy without lobar predilection. There is hypoattenuation of the periventricular white matter, most commonly indicating chronic ischemic microangiopathy. Vascular: Atherosclerotic calcification of the internal carotid arteries at the skull base. No abnormal hyperdensity of the major intracranial arteries or dural venous sinuses. Skull: The visualized skull base, calvarium and extracranial soft tissues are normal. Sinuses/Orbits: No fluid levels or advanced mucosal thickening of the visualized paranasal sinuses. No mastoid or middle ear effusion. The orbits are normal. CT CERVICAL SPINE FINDINGS Alignment: No static subluxation. Facets are aligned. Occipital condyles are normally positioned. Skull base and vertebrae: No acute fracture. Soft tissues and spinal canal: No prevertebral fluid or swelling. No visible canal hematoma. Disc levels: No advanced spinal canal or neural foraminal stenosis. Upper chest: No pneumothorax, pulmonary nodule or pleural effusion. Other: Normal visualized paraspinal cervical soft tissues. IMPRESSION: 1. No acute intracranial abnormality. 2. Chronic ischemic microangiopathy and generalized atrophy. 3. No acute fracture or static subluxation of the cervical spine. Electronically Signed   By: Deatra Robinson M.D.   On: 01/01/2023 01:25   CT Cervical Spine Wo Contrast Result Date: 01/01/2023 CLINICAL DATA:  Fall EXAM: CT  HEAD WITHOUT CONTRAST CT CERVICAL SPINE WITHOUT CONTRAST TECHNIQUE: Multidetector CT imaging of the head and cervical spine was performed following the standard protocol without intravenous contrast. Multiplanar CT image reconstructions of the cervical spine were also generated. RADIATION DOSE REDUCTION: This exam was performed according to the departmental dose-optimization program which includes automated exposure control, adjustment of the mA and/or kV according to patient size and/or use of iterative reconstruction technique. COMPARISON:  None Available. FINDINGS: CT HEAD FINDINGS Brain: There is no mass, hemorrhage or extra-axial collection. There is generalized atrophy without lobar predilection. There is hypoattenuation of the periventricular white matter, most commonly indicating chronic ischemic microangiopathy. Vascular: Atherosclerotic calcification of the internal carotid arteries at the skull base. No abnormal hyperdensity of the major intracranial arteries or  dural venous sinuses. Skull: The visualized skull base, calvarium and extracranial soft tissues are normal. Sinuses/Orbits: No fluid levels or advanced mucosal thickening of the visualized paranasal sinuses. No mastoid or middle ear effusion. The orbits are normal. CT CERVICAL SPINE FINDINGS Alignment: No static subluxation. Facets are aligned. Occipital condyles are normally positioned. Skull base and vertebrae: No acute fracture. Soft tissues and spinal canal: No prevertebral fluid or swelling. No visible canal hematoma. Disc levels: No advanced spinal canal or neural foraminal stenosis. Upper chest: No pneumothorax, pulmonary nodule or pleural effusion. Other: Normal visualized paraspinal cervical soft tissues. IMPRESSION: 1. No acute intracranial abnormality. 2. Chronic ischemic microangiopathy and generalized atrophy. 3. No acute fracture or static subluxation of the cervical spine. Electronically Signed   By: Deatra Robinson M.D.   On:  01/01/2023 01:25   CT Lumbar Spine Wo Contrast Result Date: 01/01/2023 CLINICAL DATA:  Fall EXAM: CT LUMBAR SPINE WITHOUT CONTRAST TECHNIQUE: Multidetector CT imaging of the lumbar spine was performed without intravenous contrast administration. Multiplanar CT image reconstructions were also generated. RADIATION DOSE REDUCTION: This exam was performed according to the departmental dose-optimization program which includes automated exposure control, adjustment of the mA and/or kV according to patient size and/or use of iterative reconstruction technique. COMPARISON:  11/09/2021 FINDINGS: Segmentation: 5 lumbar type vertebrae. Alignment: Levoscoliosis with apex at L4 Vertebrae: Age-indeterminate wedge compression fracture at L2 with less than 25% height loss, new since 11/09/2021. Mild progression of height loss at chronic L4 incomplete burst fracture. Paraspinal and other soft tissues: Calcific aortic atherosclerosis. Small hiatal hernia. Disc levels: No spinal canal stenosis. IMPRESSION: 1. Age-indeterminate wedge compression fracture at L2 with less than 25% height loss. 2. Mild progression of height loss at chronic L4 incomplete burst fracture. Aortic Atherosclerosis (ICD10-I70.0). Electronically Signed   By: Deatra Robinson M.D.   On: 01/01/2023 01:21    Procedures Procedures    Medications Ordered in ED Medications  acetaminophen (TYLENOL) tablet 1,000 mg (1,000 mg Oral Given 01/01/23 0011)  ketorolac (TORADOL) 15 MG/ML injection 15 mg (15 mg Intravenous Given 01/01/23 0012)  sodium chloride 0.9 % bolus 1,000 mL (1,000 mLs Intravenous New Bag/Given 01/01/23 0013)    ED Course/ Medical Decision Making/ A&P                                 Medical Decision Making Amount and/or Complexity of Data Reviewed Labs: ordered. Radiology: ordered.  Risk OTC drugs. Prescription drug management.   70 yoF with a chief complaint of left-sided low back pain.  This has been going on for a couple days  at least.  The exact etiology of her symptoms is not well-known.  Patient is demented.  She has been at home by herself for about 5 days and was noted to have new bruises.  Has a history of compression fractures to her L-spine.  Will obtain CT imaging.  CT of the head.  Blood work because she has been bedbound for at least a few days.  Blood work without significant electrolyte abnormalities, no change to renal function.  CT imaging with concern for possible L2 compression fracture.  Read is age-indeterminate by radiology.  Otherwise no obvious acute intracranial hemorrhage on my independent termination of the CT of the head, no obvious new fracture to the neck.  Family is obviously worried about the patient and would like her to come into the hospital however the patient is adamant that  she can go home and will be fine.  The patient was able to ambulate around the emergency department with a walker without issue.  I do not feel holding her against her will would make sense.  Will have her follow-up with her family doctor in the office.  Orders for home health were placed.  2:21 AM:  I have discussed the diagnosis/risks/treatment options with the patient and family.  Evaluation and diagnostic testing in the emergency department does not suggest an emergent condition requiring admission or immediate intervention beyond what has been performed at this time.  They will follow up with PCP. We also discussed returning to the ED immediately if new or worsening sx occur. We discussed the sx which are most concerning (e.g., sudden worsening pain, fever, inability to tolerate by mouth) that necessitate immediate return. Medications administered to the patient during their visit and any new prescriptions provided to the patient are listed below.  Medications given during this visit Medications  acetaminophen (TYLENOL) tablet 1,000 mg (1,000 mg Oral Given 01/01/23 0011)  ketorolac (TORADOL) 15 MG/ML injection 15  mg (15 mg Intravenous Given 01/01/23 0012)  sodium chloride 0.9 % bolus 1,000 mL (1,000 mLs Intravenous New Bag/Given 01/01/23 0013)     The patient appears reasonably screen and/or stabilized for discharge and I doubt any other medical condition or other [facility] requiring further screening, evaluation, or treatment in the ED at this time prior to discharge.          Final Clinical Impression(s) / ED Diagnoses Final diagnoses:  Fall, initial encounter  Closed compression fracture of L2 vertebra, initial encounter (HCC)    Rx / DC Orders ED Discharge Orders          Ordered    diclofenac Sodium (VOLTAREN) 1 % GEL  4 times daily        01/01/23 0215    Home Health        01/01/23 0217    Face-to-face encounter (required for Medicare/Medicaid patients)       Comments: I Rae Roam certify that this patient is under my care and that I, or a nurse practitioner or physician's assistant working with me, had a face-to-face encounter that meets the physician face-to-face encounter requirements with this patient on 01/01/2023. The encounter with the patient was in whole, or in part for the following medical condition(s) which is the primary reason for home health care (List medical condition): Pain interferes with ambulation and mobility   01/01/23 0217              Melene Plan, DO 01/01/23 0221

## 2023-01-01 DIAGNOSIS — I6523 Occlusion and stenosis of bilateral carotid arteries: Secondary | ICD-10-CM | POA: Diagnosis not present

## 2023-01-01 DIAGNOSIS — S32008D Other fracture of unspecified lumbar vertebra, subsequent encounter for fracture with routine healing: Secondary | ICD-10-CM | POA: Diagnosis not present

## 2023-01-01 DIAGNOSIS — S32028A Other fracture of second lumbar vertebra, initial encounter for closed fracture: Secondary | ICD-10-CM | POA: Diagnosis not present

## 2023-01-01 DIAGNOSIS — R29890 Loss of height: Secondary | ICD-10-CM | POA: Diagnosis not present

## 2023-01-01 DIAGNOSIS — S32020A Wedge compression fracture of second lumbar vertebra, initial encounter for closed fracture: Secondary | ICD-10-CM | POA: Diagnosis not present

## 2023-01-01 DIAGNOSIS — W19XXXA Unspecified fall, initial encounter: Secondary | ICD-10-CM | POA: Diagnosis not present

## 2023-01-01 DIAGNOSIS — S32041A Stable burst fracture of fourth lumbar vertebra, initial encounter for closed fracture: Secondary | ICD-10-CM | POA: Diagnosis not present

## 2023-01-01 DIAGNOSIS — G319 Degenerative disease of nervous system, unspecified: Secondary | ICD-10-CM | POA: Diagnosis not present

## 2023-01-01 DIAGNOSIS — I700 Atherosclerosis of aorta: Secondary | ICD-10-CM | POA: Diagnosis not present

## 2023-01-01 DIAGNOSIS — Z043 Encounter for examination and observation following other accident: Secondary | ICD-10-CM | POA: Diagnosis not present

## 2023-01-01 LAB — COMPREHENSIVE METABOLIC PANEL
ALT: 9 U/L (ref 0–44)
AST: 21 U/L (ref 15–41)
Albumin: 3.9 g/dL (ref 3.5–5.0)
Alkaline Phosphatase: 102 U/L (ref 38–126)
Anion gap: 11 (ref 5–15)
BUN: 11 mg/dL (ref 8–23)
CO2: 21 mmol/L — ABNORMAL LOW (ref 22–32)
Calcium: 8.6 mg/dL — ABNORMAL LOW (ref 8.9–10.3)
Chloride: 105 mmol/L (ref 98–111)
Creatinine, Ser: 0.76 mg/dL (ref 0.44–1.00)
GFR, Estimated: >60 mL/min (ref >60)
Glucose, Bld: 93 mg/dL (ref 70–99)
Potassium: 4.4 mmol/L (ref 3.5–5.1)
Sodium: 137 mmol/L (ref 135–145)
Total Bilirubin: 1.6 mg/dL — ABNORMAL HIGH (ref <1.2)
Total Protein: 7.5 g/dL (ref 6.5–8.1)

## 2023-01-01 LAB — CK: Total CK: 164 U/L (ref 38–234)

## 2023-01-01 MED ORDER — DICLOFENAC SODIUM 1 % EX GEL: 4.0000 g | Freq: Four times a day (QID) | CUTANEOUS | 0 refills | Status: AC

## 2023-01-01 NOTE — Discharge Instructions (Addendum)
Follow up with your family doc in the office.  

## 2023-05-16 DIAGNOSIS — M810 Age-related osteoporosis without current pathological fracture: Secondary | ICD-10-CM | POA: Diagnosis not present

## 2023-05-16 DIAGNOSIS — Z23 Encounter for immunization: Secondary | ICD-10-CM | POA: Diagnosis not present

## 2023-05-16 DIAGNOSIS — R1032 Left lower quadrant pain: Secondary | ICD-10-CM | POA: Diagnosis not present

## 2023-05-16 DIAGNOSIS — F3341 Major depressive disorder, recurrent, in partial remission: Secondary | ICD-10-CM | POA: Diagnosis not present

## 2023-05-16 DIAGNOSIS — Z8673 Personal history of transient ischemic attack (TIA), and cerebral infarction without residual deficits: Secondary | ICD-10-CM | POA: Diagnosis not present

## 2023-05-16 DIAGNOSIS — E538 Deficiency of other specified B group vitamins: Secondary | ICD-10-CM | POA: Diagnosis not present

## 2023-05-16 DIAGNOSIS — Z0000 Encounter for general adult medical examination without abnormal findings: Secondary | ICD-10-CM | POA: Diagnosis not present
